# Patient Record
Sex: Female | Born: 1950 | Race: White | Hispanic: No | Marital: Married | Smoking: Never smoker
Health system: Northeastern US, Academic
[De-identification: ages and names within clinical notes are randomized; demographics above are authoritative.]

## PROBLEM LIST (undated history)

## (undated) DIAGNOSIS — E0500 Thyrotoxicosis with diffuse goiter without thyrotoxic crisis or storm: Secondary | ICD-10-CM

## (undated) DIAGNOSIS — I10 Essential (primary) hypertension: Secondary | ICD-10-CM

## (undated) DIAGNOSIS — IMO0001 Reserved for inherently not codable concepts without codable children: Secondary | ICD-10-CM

## (undated) DIAGNOSIS — I447 Left bundle-branch block, unspecified: Secondary | ICD-10-CM

## (undated) DIAGNOSIS — I509 Heart failure, unspecified: Secondary | ICD-10-CM

## (undated) DIAGNOSIS — E039 Hypothyroidism, unspecified: Secondary | ICD-10-CM

## (undated) DIAGNOSIS — IMO0002 Reserved for concepts with insufficient information to code with codable children: Secondary | ICD-10-CM

## (undated) DIAGNOSIS — E7800 Pure hypercholesterolemia, unspecified: Secondary | ICD-10-CM

---

## 2016-03-07 ENCOUNTER — Ambulatory Visit: Admitting: Internal Medicine

## 2016-03-14 ENCOUNTER — Ambulatory Visit: Admitting: Internal Medicine

## 2016-03-14 NOTE — Progress Notes (Signed)
.  Progress Notes  .  Patient: Stephanie [name]  Provider: Webb Laws MD  .  DOB: 11-23-1950 Age: 65 Y Sex: Female  Supervising Provider:: AMANDA VEST  Date: 03/14/2016  Date: 03/14/2016  .  --------------------------------------------------------------------------------  .  REASON FOR APPOINTMENT  .  1. Shortness of breath on exertion  .  2. Heart failure reduced EF, new patient  .  HISTORY OF PRESENT ILLNESS  .  GENERAL:   We had the pleasure to see Stephanie [name] today in our clinic at  [facility]. Stephanie [name] is a 73 yo with history of HTN,  HLD and graves disease sp radioactive ablation who presents with  SOB on exertion. Stephanie [name]'s symptoms started in the early 75s.  At that time she developed SOB on exertion. She was able to walk  a block adn climb 20 steps before developing SOB. She denied  orthopnea and PND. She also complained of "chest burning" that  did not correlated with exertion. Given the SOB and the chest  burning, in 2008 she had LHC that did not show evidence of  coronary artery disease. She also underwent echocardiogram that  showed mild to moderate mitral valve regurgitation and LVEDD at  5.6 cm. She was started on atenolol and enalapril and she  continued following with her cardiologist periodically. In 2010  she was diagnosed with Graves disease and she received  radioactive iodine ablation. Her symptoms had been stable up to a  year ago when she started having worsening SOB on exertion. She  ended up having SOB on minimal exeartion but she never developed  PND, orthopnea or LE edema. She underwent work up in her home  country of [place], including a TEE that was read as severe mitral  regurgitation, for which she was referred to a Careers adviser for MVr.  However on surgical review, the MR was recognized to be less  severe and functional in nature. Her LV was more dilated compared  to 2008 (6.1 cm) and her EF had dropped to 40%. The patient was  started on bisoprolol, entresto and lasix  about three months ago.  Today (03/14/16) she presents to our clinic accompanied by her  son and her husband. She reports significant improvement of her  symptoms since initiation of bisoprolol, entresto and lasix. She  continues having some SOB on exertion but it is not limiting and  she denies PND and orthopnea. She does however report that she is  not as active as she usedx to be and might be holding back on  pushing herself due to the cardiac condition. She also denies  weight changes, LE edema. She continues to have the same chest  burning that she had back in 2008 when her LHC was normal. She  denies history of MI or history of rheumatic fever. She is over  in the [place] visiting for the summer, and will be back again in the  late fall.  .  CURRENT MEDICATIONS  .  Taking Aspirin 81 MG Tablet Chewable 1 tablet Once a day  Taking Bisoprolol Fumarate 5 mg 1 tab 1/2 tab Once a day  Taking Entresto 49-51 MG Tablet 1 tablet Twice a day  Taking Lasix 20 MG Tablet 1 tablet Once a day  Taking Levothyroxine Sodium 50 MCG Tablet 1 tablet on an empty  stomach in the morning Once a day  Taking Lipitor 10 MG Tablet 1 tablet Once a day  Taking Omeprazole 10 MG Capsule Delayed  Release 1 capsule Once a  day  Medication List reviewed and reconciled with the patient  .  PAST MEDICAL HISTORY  .  Mild-moderate mitral regurgitation  HTN  HLD  OA  Graves disease sp radioactive ablation  NICM  LBBB  .  ALLERGIES  .  None  .  SURGICAL HISTORY  .  lumpectomy  .  FAMILY HISTORY  .  Mother is alive. She has CAD. Father is deceased. No history of  heart disease.  .  SOCIAL HISTORY  .  .  Tobaccohistory:Never smoked.  .  Caffeine: 3 cups daily.  .  Alcohol Denies.  .  Patient denies tobacco, ETOH, or IVDU.  Marland Kitchen  HOSPITALIZATION/MAJOR DIAGNOSTIC PROCEDURE  .  No hospitalization for heart failure  .  REVIEW OF SYSTEMS  .  CardioVascular:  .  Neurologic    no numbness or weakness, no visual disturbance, no  difficulty speaking, parasthesias .  General/Constitutional    no  fever, chills, night sweats, sleep disturbance, headaches, no  change in appetite, no fatigue, no weight gain/loss .  Gastrointestinal    no melena, no hematochezia, hematemses, no  blood in stool, nausea, vomiting, diarrhea, abdominal pain .  Hematology    no easy bruising, no prolonged bleeding .  Peripheral Vascular    no claudication, no skin ulceration .  Endocrine    no polydipsia, polyphagia, hot and cold intolerance  . Skin    no rash . Cardiovascular    no chest pain at rest or  with exertion, no dyspnea at rest or with exertion, no  palpitations, no fluid accumulation in the legs/edema, no  increase in abnormal girth, no presyncope, no syncope, no  orthopnea, no paroxysmal nocturnal dyspnea . Respiratory    no  cough, wheezing, shortness of breath at rest, sore throat, upper  airway congestion, or hemoptysis . Musculoskeletal    no  arthraglias, myalgias, or muscle aches .  Marland Kitchen  VITAL SIGNS  .  Pain scale 0, Ht-in 66, Wt-lbs 161, BMI 25.98, BP 139/82, HR 73,  RR 16, BSA 1.84, Ht-cm 167.64, Wt-kg 73.03, SaO2 98 %.  Marland Kitchen  EXAMINATION  .  Cardiac Testing:  ZOX:WRUEA rhythm. LBBB .  Echo:  .  Echocardiogram (03/14/16)  The left ventricle is mildly dilated. There is no thrombus. There  is normal left ventricular wall thickness. Left ventricular  systolic function is moderately reduced.Ejection Fraction =  35-40%. The interventricular septum is asynchronous. Basal-mid  septal akinesis.  Mild mitral and aortic valve regurgitation.  .  .  Cardiac Catheterization:LHC (2008): without evidence of CAD per  patient report .  LabsTSH 2.36, normal renal function and chem7, normal CBC, normal  CK  .  Marland Kitchen  PHYSICAL EXAMINATION  .  General Examination:  General Appearance  well developed, well nourished, alert,  oriented, in no acute distress. Head  normocephalic. Eyes  sclera  non-icteric. Oral Cavity  mucosa moist. Neck/Thyroid  carotid  pulse normal, no carotid bruit, no jugular venous  distension.  Skin  no rashes. Heart  S1, S2 regular, no murmurs, rub or  gallops. Lungs  clear to auscultation bilaterally with no  wheezing, no crackles, no rhonchi. Chest  no costochondral  tenderness. Abdomen  soft, non tender, non distended, bowel  sounds present, no abdominal or flank bruits, no organomegaly, no  prominent pulsation, no bruits. Extremities  no edema, no  clubbing, no cyanosis. Peripheral Pulses  2 + posterior tibial, 2  + radial, 2 +  dorsalis pedis, normal symmetric pulses  bilaterally. HEENT  normocephalic, oral mucosa moist.  .  ASSESSMENTS  .  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  Mild mitral regurgitation - I34.0  .  LBBB (left bundle branch block) - I44.7  .  In summary, Stephanie [name] is a 27 yo with history of HTN, HLD and  HFrEF who presents to establish care. She had a LHC in 2008 when  she was diagnosed with HFrEF that was normal, suggesting that the  etiology of her heart failure is non-ischemic cardiomyopathy. She  also has a long-standing LBBB. Initially it was thought that her  HF was due to primary MR, however in the recent echocardiogram  she appears to have functional MV regurgitation due to LV  dilation and asynchrony of the septum. Tachycardia induced  cardiomyopathy could be in our differential in the setting of  Graves disease. However this is less likely given the fact that  it is anticipated to be a recoverable cardiomyopathy and pt  continues to have HFrEF despite adequate rate control. Patient is  currently NYHA II, class C. She appears euvolemic on physical  exam. Today's echocardiogram showed decreased EF at 35-40%,  mildly dilated LV and mild MR and AI. Our goal is to optimize  medical management and then perform a cardiac MRI for better  characterization of the cardiac structure. The patient is having  LBBB with QRS of 150 msec. If she remains symptomatic despite  optimal medical therapy and if there is any downtrending of the  LVEF she would be a candidate  for ressynchronization therapy. The  plan for today is:- We will discontinue calcium channel blocker  in order to have room for optimization of Entresto- We plan to  increase Entresto to two tablets BID (equivalent to the 97/101mg   dose). We educated the patient about adverse effects of Entresto  and we asked her to decrease the dose back to 1 tab bid and call  our clinic if she develops dizziness, lightheadedness, fatigue. -  If patient tolerates the increased dose of entresto we will add  spironolactone at the next visit- Continue bisoprolol - littel  room for uptitration given that HR is 70bpm - Continue lasix at  current dose- Basic metabolic panel and BNP in a week (gave pt a  slip) - Cardiac MRI before her next appointment in December to  reassess LVEF and for scaring in the septal region to explain  LBBB- She may benefit from cardiac rehab - she will do cardiac  rehab once she returns back to [place] in a month. - Follow up in  4-5 months during next visit to [place].  Marland Kitchen  TREATMENT  .  Others  Notes: I personally interviewed and examined the patient and both  the fellow (Dr Tiburcio Bash and I contributed to this  electronic note. I agree with the history, exam, assessment and  plan as detailed in this note and edited it as necessary. I spent  70 minutes total in this patient encounter, of which >50% was  counselling and coordination of care.  .  FOLLOW UP  .  4-5 Months  .  Electronically signed by Boris Lown MD on  03/17/2016 at 09:47 PM EDT  .  CONFIRMATORY SIGN OFF  I personally interviewed and examined the patient and both the fellow (Dr Tiburcio Bash and I contributed to this electronic note. I agree with the history, exam, assessment and plan as detailed in this note and edited  it as necessary.  .  Document electronically signed by VEST, Dyanne Carrel MD  .

## 2016-03-14 NOTE — Progress Notes (Signed)
* * *        Stephanie [name]    --- ---    89 Y old Female, DOB: Aug 14, 1951, External MRN: [id number]    Account Number: [id number]    [address] [place], [id number]    Home: [phone]    Insurance: MEDICARE    Referring: Unknown Unknown    Appointment Facility: [facility]        * * *    03/14/2016  Progress Notes: Tahira Olivarez **CHN#:** [id number]    --- ---    ---        Reason for Appointment    ---      1\. Shortness of breath on exertion    ---    2\. Heart failure reduced EF, new patient    ---      History of Present Illness    ---     _GENERAL_ :    We had the pleasure to see Stephanie [name] today in our clinic at [facility]. Stephanie [name] is a 65 yo with history of HTN, HLD and graves disease sp  radioactive ablation who presents with SOB on exertion.    Stephanie [name]'s symptoms started in the early 70s. At that time she developed SOB  on exertion. She was able to walk a block adn climb 20 steps before developing  SOB. She denied orthopnea and PND. She also complained of "chest burning" that  did not correlated with exertion. Given the SOB and the chest burning, in 2008  she had LHC that did not show evidence of coronary artery disease. She also  underwent echocardiogram that showed mild to moderate mitral valve  regurgitation and LVEDD at 5.6 cm. She was started on atenolol and enalapril  and she continued following with her cardiologist periodically. In 2010 she  was diagnosed with Graves disease and she received radioactive iodine  ablation.    Her symptoms had been stable up to a year ago when she started having  worsening SOB on exertion. She ended up having SOB on minimal exeartion but  she never developed PND, orthopnea or LE edema. She underwent work up in her  home country of [place], including a TEE that was read as severe mitral  regurgitation, for which she was referred to a Careers adviser for MVr. However on  surgical review, the MR was recognized to be less severe and functional  in  nature. Her LV was more dilated compared to 2008 (6.1 cm) and her EF had  dropped to 40%. The patient was started on bisoprolol, entresto and lasix  about three months ago.    Today (03/14/16) she presents to our clinic accompanied by her son and her  husband. She reports significant improvement of her symptoms since initiation  of bisoprolol, entresto and lasix. She continues having some SOB on exertion  but it is not limiting and she denies PND and orthopnea. She does however  report that she is not as active as she usedx to be and might be holding back  on pushing herself due to the cardiac condition. She also denies weight  changes, LE edema. She continues to have the same chest burning that she had  back in 2008 when her LHC was normal. She denies history of MI or history of  rheumatic fever. She is over in the [place] visiting for the summer, and will be  back again in the late fall.      Current Medications    ---  Taking     * Aspirin 81 MG Tablet Chewable 1 tablet Once a day    ---    * Bisoprolol Fumarate 5 mg 1 tab 1/2 tab Once a day    ---    * Entresto 49-51 MG Tablet 1 tablet Twice a day    ---    * Lasix 20 MG Tablet 1 tablet Once a day    ---    * Levothyroxine Sodium 50 MCG Tablet 1 tablet on an empty stomach in the morning Once a day    ---    * Lipitor 10 MG Tablet 1 tablet Once a day    ---    * Omeprazole 10 MG Capsule Delayed Release 1 capsule Once a day    ---    * Medication List reviewed and reconciled with the patient    ---      Past Medical History    ---       Mild-moderate mitral regurgitation.        ---    HTN.        ---    HLD.        ---    OA.        ---    Graves disease sp radioactive ablation.        ---    NICM.        ---    LBBB.        ---      Surgical History    ---      lumpectomy    ---      Family History    ---      Mother is alive. She has CAD. Father is deceased. No history of heart  disease.    ---      Social History    ---    Tobacco  history: Never smoked.     Caffeine: 3 cups daily.    Alcohol  Denies.   Patient denies tobacco, ETOH, or IVDU.    ---      Allergies    ---      None    ---      Hospitalization/Major Diagnostic Procedure    ---      No hospitalization for heart failure    ---      Review of Systems    ---     _CardioVascular_ :    Neurologic no numbness or weakness, no visual disturbance, no difficulty  speaking, parasthesias. General/Constitutional no fever, chills, night sweats,  sleep disturbance, headaches, no change in appetite, no fatigue, no weight  gain/loss. Gastrointestinal no melena, no hematochezia, hematemses, no blood  in stool, nausea, vomiting, diarrhea, abdominal pain. Hematology no easy  bruising, no prolonged bleeding. Peripheral Vascular no claudication, no skin  ulceration. Endocrine no polydipsia, polyphagia, hot and cold intolerance.  Skin no rash. Cardiovascular no chest pain at rest or with exertion, no  dyspnea at rest or with exertion, no palpitations, no fluid accumulation in  the legs/edema, no increase in abnormal girth, no presyncope, no syncope, no  orthopnea, no paroxysmal nocturnal dyspnea. Respiratory no cough, wheezing,  shortness of breath at rest, sore throat, upper airway congestion, or  hemoptysis. Musculoskeletal no arthraglias, myalgias, or muscle aches.          Vital Signs    ---    Pain scale 0, Ht-in 66, Wt-lbs 161, BMI 25.98, BP 139/82, HR 73, RR 16,  BSA  1.84, Ht-cm 167.64, Wt-kg 73.03, SaO2 98 %.      Examination    ---     _Cardiac Testing_ :    EKG: Sinus rhythm. LBBB .    Echo:    Echocardiogram (03/14/16)    The left ventricle is mildly dilated. There is no thrombus. There is normal  left ventricular wall thickness. Left ventricular systolic function is  moderately reduced.Ejection Fraction = 35-40%. The interventricular septum is  asynchronous. Basal-mid septal akinesis.    Mild mitral and aortic valve regurgitation.          .    Cardiac Catheterization: LHC (2008): without evidence of CAD per  patient  report .    Labs TSH 2.36, normal renal function and chem7, normal CBC, normal CK    .          Physical Examination    ---     _General Examination_ :    General Appearance well developed, well nourished, alert, oriented, in no  acute distress. Head normocephalic. Eyes sclera non-icteric. Oral Cavity  mucosa moist. Neck/Thyroid carotid pulse normal, no carotid bruit, no jugular  venous distension. Skin no rashes. Heart S1, S2 regular, no murmurs, rub or  gallops. Lungs clear to auscultation bilaterally with no wheezing, no  crackles, no rhonchi. Chest no costochondral tenderness. Abdomen soft, non  tender, non distended, bowel sounds present, no abdominal or flank bruits, no  organomegaly, no prominent pulsation, no bruits. Extremities no edema, no  clubbing, no cyanosis. Peripheral Pulses 2 + posterior tibial, 2 + radial, 2 +  dorsalis pedis, normal symmetric pulses bilaterally. HEENT normocephalic, oral  mucosa moist.          Assessments    ---    1\. 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 Mild mitral regurgitation - I34.0    ---    3\. LBBB (left bundle branch block) - I44.7    ---      In summary, Stephanie Mcsorley is a 78 yo with history of HTN, HLD and HFrEF who  presents to establish care. She had a LHC in 2008 when she was diagnosed with  HFrEF that was normal, suggesting that the etiology of her heart failure is  non-ischemic cardiomyopathy. She also has a long-standing LBBB. Initially it  was thought that her HF was due to primary MR, however in the recent  echocardiogram she appears to have functional MV regurgitation due to LV  dilation and asynchrony of the septum. Tachycardia induced cardiomyopathy  could be in our differential in the setting of Graves disease. However this is  less likely given the fact that it is anticipated to be a recoverable  cardiomyopathy and pt continues to have HFrEF despite adequate rate control.    Patient is currently NYHA II, class C. She appears  euvolemic on physical exam.  Today's echocardiogram showed decreased EF at 35-40%, mildly dilated LV and  mild MR and AI. Our goal is to optimize medical management and then perform a  cardiac MRI for better characterization of the cardiac structure. The patient  is having LBBB with QRS of 150 msec. If she remains symptomatic despite  optimal medical therapy and if there is any downtrending of the LVEF she would  be a candidate for ressynchronization therapy.    The plan for today is:    - We will discontinue calcium channel blocker in order to have room for  optimization of  Sherryll Burger    - We plan to increase Entresto to two tablets BID (equivalent to the 97/101mg   dose). We educated the patient about adverse effects of Entresto and we asked  her to decrease the dose back to 1 tab bid and call our clinic if she develops  dizziness, lightheadedness, fatigue.    - If patient tolerates the increased dose of entresto we will add  spironolactone at the next visit    - Continue bisoprolol - littel room for uptitration given that HR is 70bpm    - Continue lasix at current dose    - Basic metabolic panel and BNP in a week (gave pt a slip)    - Cardiac MRI before her next appointment in December to reassess LVEF and  for scaring in the septal region to explain LBBB    - She may benefit from cardiac rehab - she will do cardiac rehab once she  returns back to [place] in a month.    - Follow up in 4-5 months during next visit to [place].    ---      Treatment    ---       **1\. Others**    Notes: I personally interviewed and examined the patient and both the fellow  (Dr Tiburcio Bash and I contributed to this electronic note. I agree with  the history, exam, assessment and plan as detailed in this note and edited it  as necessary. I spent 70 minutes total in this patient encounter, of which  >50% was counselling and coordination of care.    ---      Follow Up    ---    4-5 Months    Electronically signed by Boris Lown MD on  03/17/2016 at 09:47 PM EDT    Sign off status: Completed        * * *        Cardiac Subspecialty    [address]    [place], [place] [address]    Tel: [phone]    Fax: [phone]              * * *          Patient: MARIONA, SCHOLES DOB: Feb 03, 1951 Progress Note: Khala Tarte  03/14/2016    ---    Note generated by eClinicalWorks EMR/PM Software ([email or web address])

## 2016-03-20 ENCOUNTER — Ambulatory Visit: Admitting: Internal Medicine

## 2016-03-20 NOTE — Progress Notes (Signed)
* * *        **  Stephanie [name]**    --- ---    20 Y old Female, DOB: 1951-06-12    [address] [place], [place] [address]    Home: [phone]    Provider: Nyasha Rahilly, Dyanne Carrel, MD        * * *    Telephone Encounter    ---    Answered by   Boris Lown R  Date: 03/20/2016         Time: 10:09 AM    Message                      Called [name] back on [phone] to relay the final read on her echo report to her. Plan remains for MRI and clinic f/up when she visits again in December. Currenlty LVEF above the threshold for CRT-D, but will reassess. Entresto increase is going okay thus far on day 2. She will do labs locally on Saturday.       She asked that I mail her the clinic note and send a script for the higher Entresto dose for when she needs a refill - she will send me an address and pharmacy to do so.         --- ---            Action Taken   Thanks [place]. The labs arrived this afternoon - please can you  pass along the message to your mom that they all look great and no med changes  are needed (Cr 0.69, K 4.9, BNP 32, Na 144). Is she doing okay in terms of  blood pressure and symptoms? I will mail out her clinic note to your address,  so that she can take it to her cardiologist back home, and send a refill for  the higher dose of [facility]rest[facility] to the [facility] in case she starts to run out of pills.  Best wishes, Marchelle Folks From: Arne Cleveland Sent: Monday, March 26, 2016 6:59 PM  To: Boris Lown Subject: Re: my mom Stephanie [name], I hope you're doing well.  Thanks for the birthday wishes by the way. My mom did her labs on Saturday, so  they should be heading your way soon. She mentioned that you needed my address  and a pharmacy: Address is: [address] [place], [place] [address] [facility] [address] [place], [place] [address] Tel: [phone] Thanks            Refills  Refill Entresto Tablet, 97-103 MG, Orally, 60, 1 tablet, Twice a day,  30 days, Refills=11    --- ---          * * *                ---          * * *           PatientRoxan [name] DOB: 1951-03-21 Provider: Webb Laws, MD  03/20/2016    ---    Note generated by eClinicalWorks EMR/PM Software ([email or web address])

## 2016-03-26 ENCOUNTER — Ambulatory Visit

## 2016-04-20 ENCOUNTER — Ambulatory Visit

## 2016-04-20 ENCOUNTER — Ambulatory Visit: Admitting: Internal Medicine

## 2016-04-20 NOTE — Progress Notes (Signed)
* * *        **  Stephanie [name]**    --- ---    81 Y old Female, DOB: 28-Feb-1951    [address], [place], [place] [address]    Home: [phone]    Provider: Paetyn Pietrzak, Dyanne Carrel, MD        * * *    Telephone Encounter    ---    Answered by   Gina Costilla R  Date: 04/20/2016         Time: 06:21 PM    Refills  Refill Entresto Tablet, 97-103 MG, Orally, 60, 1 tablet, Twice a day,  30 days, Refills=11    --- ---          * * *                ---          * * *          PatientRoxan [name] DOB: 1951/05/31 Provider: Webb Laws, MD  04/20/2016    ---    Note generated by eClinicalWorks EMR/PM Software ([email or web address])

## 2016-05-09 ENCOUNTER — Emergency Department (HOSPITAL_COMMUNITY)
Admission: EM | Admit: 2016-05-09 | Discharge: 2016-05-10 | Disposition: A | Discharge status: Home / Self Care | Payer: Medicare Other | Attending: Emergency Medicine | Admitting: Emergency Medicine

## 2016-05-09 ENCOUNTER — Encounter (HOSPITAL_COMMUNITY): Payer: Self-pay | Admitting: Emergency Medicine

## 2016-05-09 DIAGNOSIS — I110 Hypertensive heart disease with heart failure: Secondary | ICD-10-CM | POA: Diagnosis not present

## 2016-05-09 DIAGNOSIS — E039 Hypothyroidism, unspecified: Secondary | ICD-10-CM | POA: Diagnosis not present

## 2016-05-09 DIAGNOSIS — R1013 Epigastric pain: Secondary | ICD-10-CM | POA: Diagnosis present

## 2016-05-09 DIAGNOSIS — K449 Diaphragmatic hernia without obstruction or gangrene: Secondary | ICD-10-CM | POA: Diagnosis not present

## 2016-05-09 DIAGNOSIS — I509 Heart failure, unspecified: Secondary | ICD-10-CM | POA: Insufficient documentation

## 2016-05-09 DIAGNOSIS — Z7982 Long term (current) use of aspirin: Secondary | ICD-10-CM | POA: Insufficient documentation

## 2016-05-09 DIAGNOSIS — Z79899 Other long term (current) drug therapy: Secondary | ICD-10-CM | POA: Insufficient documentation

## 2016-05-09 HISTORY — DX: Essential (primary) hypertension: I10

## 2016-05-09 HISTORY — DX: Thyrotoxicosis with diffuse goiter without thyrotoxic crisis or storm: E05.00

## 2016-05-09 HISTORY — DX: Heart failure, unspecified: I50.9

## 2016-05-09 HISTORY — DX: Hypothyroidism, unspecified: E03.9

## 2016-05-09 HISTORY — DX: Reserved for inherently not codable concepts without codable children: IMO0001

## 2016-05-09 HISTORY — DX: Left bundle-branch block, unspecified: I44.7

## 2016-05-09 HISTORY — DX: Reserved for concepts with insufficient information to code with codable children: IMO0002

## 2016-05-09 HISTORY — DX: Pure hypercholesterolemia, unspecified: E78.00

## 2016-05-09 LAB — CBC
HEMATOCRIT: 40.9 % (ref 36.0–46.0)
HEMOGLOBIN: 14.1 g/dL (ref 12.0–15.0)
MCH: 29 pg (ref 26.0–34.0)
MCHC: 34.5 g/dL (ref 30.0–36.0)
MCV: 84.2 fL (ref 78.0–100.0)
Platelets: 243 10*3/uL (ref 150–400)
RBC: 4.86 MIL/uL (ref 3.87–5.11)
RDW: 13.7 % (ref 11.5–15.5)
WBC: 10.5 10*3/uL (ref 4.0–10.5)

## 2016-05-09 LAB — URINALYSIS, ROUTINE W REFLEX MICROSCOPIC
BILIRUBIN URINE: NEGATIVE
Glucose, UA: NEGATIVE mg/dL
HGB URINE DIPSTICK: NEGATIVE
KETONES UR: NEGATIVE mg/dL
NITRITE: NEGATIVE
PROTEIN: NEGATIVE mg/dL
Specific Gravity, Urine: 1.018 (ref 1.005–1.030)
pH: 6.5 (ref 5.0–8.0)

## 2016-05-09 LAB — COMPREHENSIVE METABOLIC PANEL
ALBUMIN: 4.5 g/dL (ref 3.5–5.0)
ALT: 29 U/L (ref 14–54)
ANION GAP: 8 (ref 5–15)
AST: 24 U/L (ref 15–41)
Alkaline Phosphatase: 49 U/L (ref 38–126)
BILIRUBIN TOTAL: 0.5 mg/dL (ref 0.3–1.2)
BUN: 21 mg/dL — ABNORMAL HIGH (ref 6–20)
CO2: 21 mmol/L — AB (ref 22–32)
Calcium: 9.2 mg/dL (ref 8.9–10.3)
Chloride: 107 mmol/L (ref 101–111)
Creatinine, Ser: 0.72 mg/dL (ref 0.44–1.00)
GFR calc non Af Amer: >60 mL/min (ref >60)
GLUCOSE: 168 mg/dL — AB (ref 65–99)
POTASSIUM: 3.6 mmol/L (ref 3.5–5.1)
SODIUM: 136 mmol/L (ref 135–145)
TOTAL PROTEIN: 7.7 g/dL (ref 6.5–8.1)

## 2016-05-09 LAB — I-STAT TROPONIN, ED: TROPONIN I, POC: 0 ng/mL (ref 0.00–0.08)

## 2016-05-09 LAB — URINE MICROSCOPIC-ADD ON

## 2016-05-09 LAB — PROTIME-INR
INR: 0.94
Prothrombin Time: 12.6 seconds (ref 11.4–15.2)

## 2016-05-09 LAB — TROPONIN I: Troponin I: <0.03 ng/mL (ref <0.03)

## 2016-05-09 LAB — LIPASE, BLOOD: Lipase: 23 U/L (ref 11–51)

## 2016-05-09 LAB — BRAIN NATRIURETIC PEPTIDE: B Natriuretic Peptide: 30.2 pg/mL (ref 0.0–100.0)

## 2016-05-09 MED ORDER — SODIUM CHLORIDE 0.9 % IV SOLN
INTRAVENOUS | Status: DC
  Administered 2016-05-10: 01:00:00 via INTRAVENOUS

## 2016-05-09 MED ORDER — FAMOTIDINE IN NACL 20-0.9 MG/50ML-% IV SOLN
20.0000 mg | Freq: Once | INTRAVENOUS | Status: AC
  Administered 2016-05-09: 20 mg via INTRAVENOUS
  Filled 2016-05-09: qty 50

## 2016-05-09 NOTE — ED Triage Notes (Signed)
Pt states that she started having epigastric pain 3 hours ago with dizziness and nausea. Alert and oriented. Hx of LBBB and mild CHF.

## 2016-05-10 ENCOUNTER — Emergency Department (HOSPITAL_COMMUNITY): Payer: Medicare Other

## 2016-05-10 MED ORDER — HYDROCODONE-ACETAMINOPHEN 5-325 MG PO TABS: 1.0000 | ORAL_TABLET | ORAL | 0 refills | Status: AC | PRN

## 2016-05-10 MED ORDER — HYDROCODONE-ACETAMINOPHEN 5-325 MG PO TABS
1.0000 | ORAL_TABLET | Freq: Once | ORAL | Status: AC
  Administered 2016-05-10: 1 via ORAL
  Filled 2016-05-10: qty 1

## 2016-05-10 MED ORDER — FAMOTIDINE 20 MG PO TABS: 20.0000 mg | ORAL_TABLET | Freq: Two times a day (BID) | ORAL | 0 refills | Status: AC

## 2016-05-10 MED ORDER — SUCRALFATE 1 GM/10ML PO SUSP: 1.0000 g | Freq: Three times a day (TID) | ORAL | 0 refills | Status: AC

## 2016-05-10 NOTE — ED Provider Notes (Signed)
[facility]-EMERGENCY DEPT Provider Note   CSN: [id number] Arrival date & time: 05/09/16  2038     History   Chief Complaint Chief Complaint  Patient presents with  . Abdominal Pain    HPI Paula Dunlap is a 65 y.o. female.  HPI Patient developed epigastric pain that came on fairly rapidly this evening. She denies radiation into her back. She did have an episode of vomiting in association with this. The pain is aching and cramping in quality. No associated diarrhea. Patient denies pain burning or urgency to urination. She reports she did have similar pain once but ultimately is started to go away so she did not have any diagnostic  Evaluation. Patient reports she does have a history of heart disease with congestive heart failure and a known left bundle-branch block.. Patient denies having any urinary symptoms of pain burning or urgency. Patient does still have her gallbladder. Past Medical History:  Diagnosis Date  . CHF (congestive heart failure) (HCC)   . Graves disease   . High cholesterol   . Hypertension   . Hypothyroid   . LBBB (left bundle branch block)   . Radiation    Thyroid    There are no active problems to display for this patient.   History reviewed. No pertinent surgical history.  OB History    No data available       Home Medications    Prior to Admission medications   Medication Sig Start Date End Date Taking? Authorizing Provider  aspirin 81 MG tablet Take 81 mg by mouth daily.   Yes Historical Provider, MD  atorvastatin (LIPITOR) 10 MG tablet Take 10 mg by mouth daily.   Yes Historical Provider, MD  bisoprolol (ZEBETA) 5 MG tablet Take 2.5 mg by mouth daily.   Yes Historical Provider, MD  furosemide (LASIX) 20 MG tablet Take 20 mg by mouth daily.   Yes Historical Provider, MD  levothyroxine (SYNTHROID, LEVOTHROID) 50 MCG tablet Take 50-100 mcg by mouth daily before breakfast. Sun-fri she takes 50 mcg and sat-sun she takes 100 mcg   Yes Historical  Provider, MD  omeprazole (PRILOSEC) 10 MG capsule Take 10 mg by mouth daily.   Yes Historical Provider, MD  Sacubitril-Valsartan (ENTRESTO PO) Take 100 mg by mouth 2 (two) times daily.   Yes Historical Provider, MD  famotidine (PEPCID) 20 MG tablet Take 1 tablet (20 mg total) by mouth 2 (two) times daily. 05/10/16   Arby Barrette, MD  HYDROcodone-acetaminophen (NORCO/VICODIN) 5-325 MG tablet Take 1-2 tablets by mouth every 4 (four) hours as needed for moderate pain or severe pain. 05/10/16   Arby Barrette, MD  sucralfate (CARAFATE) 1 GM/10ML suspension Take 10 mLs (1 g total) by mouth 4 (four) times daily -  with meals and at bedtime. 05/10/16   Arby Barrette, MD    Family History No family history on file.  Social History Social History  Substance Use Topics  . Smoking status: Never Smoker  . Smokeless tobacco: Not on file  . Alcohol use No     Allergies   Review of patient's allergies indicates no known allergies.   Review of Systems Review of Systems 10 Systems reviewed and are negative for acute change except as noted in the HPI.  Physical Exam Updated Vital Signs BP 133/80 (BP Location: Right Arm)   Pulse 69   Temp 97.6 F (36.4 C) (Oral)   Resp 19   Ht 5\' 2"  (1.575 m)   Wt 154 lb 5.2 oz (  70 kg)   SpO2 95%   BMI 28.23 kg/m   Physical Exam  Constitutional: She is oriented to person, place, and time. She appears well-developed and well-nourished. No distress.  HENT:  Head: Normocephalic and atraumatic.  Right Ear: External ear normal.  Left Ear: External ear normal.  Nose: Nose normal.  Mouth/Throat: Oropharynx is clear and moist.  Eyes: Conjunctivae and EOM are normal.  Neck: Neck supple.  Cardiovascular: Normal rate and regular rhythm.   No murmur heard. Pulmonary/Chest: Effort normal and breath sounds normal. No respiratory distress.  Abdominal: Soft. There is tenderness.  Moderate epigastric pain without guarding or palpable mass.  Musculoskeletal: She  exhibits no edema, tenderness or deformity.  Neurological: She is alert and oriented to person, place, and time. No cranial nerve deficit. She exhibits normal muscle tone. Coordination normal.  Skin: Skin is warm and dry.  Psychiatric: She has a normal mood and affect.  Nursing note and vitals reviewed.    ED Treatments / Results  Labs (all labs ordered are listed, but only abnormal results are displayed) Labs Reviewed  COMPREHENSIVE METABOLIC PANEL - Abnormal; Notable for the following:       Result Value   CO2 21 (*)    Glucose, Bld 168 (*)    BUN 21 (*)    All other components within normal limits  URINALYSIS, ROUTINE W REFLEX MICROSCOPIC (NOT AT [facility]) - Abnormal; Notable for the following:    Leukocytes, UA MODERATE (*)    All other components within normal limits  URINE MICROSCOPIC-ADD ON - Abnormal; Notable for the following:    Squamous Epithelial / LPF 6-30 (*)    Bacteria, UA RARE (*)    All other components within normal limits  LIPASE, BLOOD  CBC  BRAIN NATRIURETIC PEPTIDE  TROPONIN I  PROTIME-INR  CBC WITH DIFFERENTIAL/PLATELET  I-STAT TROPOININ, ED    EKG  EKG Interpretation  Date/Time:  Wednesday May 09 2016 21:02:52 EDT Ventricular Rate:  83 PR Interval:    QRS Duration: 159 QT Interval:  413 QTC Calculation: 486 R Axis:   -16 Text Interpretation:  Sinus rhythm Left bundle branch block Baseline wander in lead(s) V1 agree. ischemic appearing ST depression Confirmed by Donnald GarrePfeiffer, MD, Lebron ConnersMarcy [phone]([id number]) on 05/09/2016 9:07:05 PM       Radiology Ct Abdomen Pelvis Wo Contrast  Result Date: 05/10/2016 CLINICAL DATA:  Epigastric pain, dizziness, and nausea beginning 3 hours ago. EXAM: CT ABDOMEN AND PELVIS WITHOUT CONTRAST TECHNIQUE: Multidetector CT imaging of the abdomen and pelvis was performed following the standard protocol without IV contrast. COMPARISON:  None. FINDINGS: Lower chest: Calcified granulomas in the left lung base. Calcification in the  aortic valve. Coronary artery calcifications. Small esophageal hiatal hernia. Hepatobiliary: Diffuse fatty infiltration of the liver. Focal low-attenuation adjacent to the gallbladder fossa is probably focal fatty change. Gallbladder and bile ducts are unremarkable. Pancreas: Unenhanced appearance is unremarkable. Spleen: Unenhanced appearance is unremarkable. Adrenals/Urinary Tract: Calcification in the right adrenal gland may be represents a old hemorrhage or old infection. Cyst in the left kidney. No hydronephrosis in either kidney. No renal or ureteral stones. Bladder wall is not thickened. Stomach/Bowel: Stomach is within normal limits. Appendix appears normal. No evidence of bowel wall thickening, distention, or inflammatory changes. Vascular/Lymphatic: Aortic atherosclerosis. No enlarged abdominal or pelvic lymph nodes. Reproductive: There is a large uterine mass, likely fibroid, measuring about 9.7 x 13.8 cm in diameter. No abnormal adnexal masses. Other: No abdominal wall hernia or abnormality. No abdominopelvic ascites.  Musculoskeletal: Degenerative changes in the spine. No destructive bone lesions. Slight anterior subluxation at L4-5. IMPRESSION: Large uterine mass, likely a fibroid. Diffuse fatty infiltration of the liver. No evidence of bowel obstruction or inflammation. Small esophageal hiatal hernia. Electronically Signed   By: Burman Nieves M.D.   On: 05/10/2016 01:04    Procedures Procedures (including critical care time)  Medications Ordered in ED Medications  0.9 %  sodium chloride infusion ( Intravenous New Bag/Given 05/10/16 0055)  HYDROcodone-acetaminophen (NORCO/VICODIN) 5-325 MG per tablet 1 tablet (not administered)  famotidine (PEPCID) IVPB 20 mg premix (0 mg Intravenous Stopped 05/09/16 2202)     Initial Impression / Assessment and Plan / ED Course  I have reviewed the triage vital signs and the nursing notes.  Pertinent labs & imaging results that were available during  my care of the patient were reviewed by me and considered in my medical decision making (see chart for details).  Clinical Course     Final Clinical Impressions(s) / ED Diagnoses   Final diagnoses:  Hiatal hernia  Epigastric pain  Diagnostic workup does not reveal specific etiology for the patient's symptoms. Symptoms however seems very gastric in nature. They are exacerbated by drinking contrast. They were onset several hours after eating fried chicken bites. Patient does not show signs of biliary inflammation or elevated LFTs. I did however review with patient and family members possibility, of needing a HIDA scan to further assess the gallbladder. At this time I do feel patient  Can continue outpatient treatment. She also has hiatal hernia which we treated as usual. Will add carafate and pepcid.  New Prescriptions New Prescriptions   FAMOTIDINE (PEPCID) 20 MG TABLET    Take 1 tablet (20 mg total) by mouth 2 (two) times daily.   HYDROCODONE-ACETAMINOPHEN (NORCO/VICODIN) 5-325 MG TABLET    Take 1-2 tablets by mouth every 4 (four) hours as needed for moderate pain or severe pain.   SUCRALFATE (CARAFATE) 1 GM/10ML SUSPENSION    Take 10 mLs (1 g total) by mouth 4 (four) times daily -  with meals and at bedtime.     Arby Barrette, MD 05/10/16 [phone]

## 2016-05-11 LAB — URINE CULTURE

## 2016-06-06 ENCOUNTER — Ambulatory Visit

## 2017-01-30 ENCOUNTER — Ambulatory Visit: Admitting: Internal Medicine

## 2017-01-30 LAB — HX CHEM-PANELS
HX ANION GAP: 7 (ref 5–18)
HX BLOOD UREA NITROGEN: 20 mg/dL (ref 6–24)
HX CHLORIDE (CL): 109 meq/L (ref 98–110)
HX CO2: 25 meq/L (ref 20–30)
HX CREATININE (CR): 0.75 mg/dL (ref 0.57–1.30)
HX GFR, AFRICAN AMERICAN: 96 mL/min/{1.73_m2}
HX GFR, NON-AFRICAN AMERICAN: 82 mL/min/{1.73_m2} — ABNORMAL LOW
HX GLUCOSE: 86 mg/dL (ref 70–139)
HX POTASSIUM (K): 4.7 meq/L (ref 3.6–5.1)
HX SODIUM (NA): 141 meq/L (ref 135–145)

## 2017-01-30 LAB — HX CHOLESTEROL
HX CHOLESTEROL: 168 mg/dL (ref 110–199)
HX HIGH DENSITY LIPOPROTEIN CHOL (HDL): 40 mg/dL (ref 35–75)
HX LDL: 104 mg/dL (ref 0–129)
HX TRIGLYCERIDES: 122 mg/dL (ref 40–250)

## 2017-01-30 LAB — HX HEM-ROUTINE
HX BASO #: 0 10*3/uL (ref 0.0–0.2)
HX BASO: 1 %
HX EOSIN #: 0.2 10*3/uL (ref 0.0–0.5)
HX EOSIN: 2 %
HX HCT: 42.6 % (ref 32.0–45.0)
HX HGB: 14.5 g/dL (ref 11.0–15.0)
HX IMMATURE GRANULOCYTE#: 0 10*3/uL (ref 0.0–0.1)
HX IMMATURE GRANULOCYTE: 0 %
HX LYMPH #: 3.4 10*3/uL (ref 1.0–4.0)
HX LYMPH: 44 %
HX MCH: 29 pg (ref 26.0–34.0)
HX MCHC: 34 g/dL (ref 32.0–36.0)
HX MCV: 85.2 fL (ref 80.0–98.0)
HX MONO #: 0.5 10*3/uL (ref 0.2–0.8)
HX MONO: 6 %
HX MPV: 10.6 fL (ref 9.1–11.7)
HX NEUT #: 3.7 10*3/uL (ref 1.5–7.5)
HX PLT: 214 10*3/uL (ref 150–400)
HX RBC BLOOD COUNT: 5 M/uL (ref 3.70–5.00)
HX RDW: 13.4 % (ref 11.5–14.5)
HX SEG NEUT: 47 %
HX WBC: 7.8 10*3/uL (ref 4.0–11.0)

## 2017-01-30 LAB — HX CHEM-LIPIDS
HX CHOL-HDL RATIO: 4.2
HX CHOLESTEROL: 168 mg/dL (ref 110–199)
HX HIGH DENSITY LIPOPROTEIN CHOL (HDL): 40 mg/dL (ref 35–75)
HX HOURS FAST: 4 h
HX LDL: 104 mg/dL (ref 0–129)
HX TRIGLYCERIDES: 122 mg/dL (ref 40–250)

## 2017-01-30 NOTE — Progress Notes (Signed)
* * *        Roxan Hockey    --- ---    66 Y old Female, DOB: [birth year], External MRN: [id number]    Account Number: [id number]    [address], [place], [id number]    Home: [phone]    Insurance: MEDICARE    PCP: Unknown Unknown Referring: Unknown Unknown    Appointment Facility: [facility]        * * *    01/30/2017  Progress Notes: Gavina Dildine **CHN#:** [id number]    --- ---    ---        Reason for Appointment    ---      1\. CHF F/U APPT. / ECHO @ 1PM    ---      History of Present Illness    ---     _GENERAL_ :    We had the pleasure to see Mrs Newmann today in our clinic at [facility]. Mrs Dercole is a 66 yo with history of HTN, HLD and graves disease sp  radioactive ablation who presents with SOB on exertion.    Mrs Scheeler's symptoms started in the early 30s. At that time she developed SOB  on exertion. She was able to walk a block adn climb 20 steps before developing  SOB. She denied orthopnea and PND. She also complained of "chest burning" that  did not correlated with exertion. Given the SOB and the chest burning, in 2008  she had LHC that did not show evidence of coronary artery disease. She also  underwent echocardiogram that showed mild to moderate mitral valve  regurgitation and LVEDD at 5.6 cm. She was started on atenolol and enalapril  and she continued following with her cardiologist periodically. In 2010 she  was diagnosed with Graves disease and she received radioactive iodine  ablation.    Her symptoms had been stable up to a year ago when she started having  worsening SOB on exertion. She ended up having SOB on minimal exeartion but  she never developed PND, orthopnea or LE edema. She underwent work up in her  home country of [place], including a TEE that was read as severe mitral  regurgitation, for which she was referred to a Careers adviser for [facility]. However on  surgical review, the MR was recognized to be less severe and functional in  nature. Her LV was more dilated  compared to 2008 (6.1 cm) and her EF had  dropped to 40%. The patient was started on bisoprolol, entresto and lasix  about three months ago.    Mrs Munce was last seen almost a year ago in 03/14/16. She has been back in  the [place] visiting family this summer and so presents for follow-u today 01/30/17.  SHe has been very well in the interim. Her husband has been undergoing cancer  therapy and so she has been busy supporting him but she denies any exercise  limitations. Can walk 25 mins comfortably. No dyspnea on exertion, orthopnea,  palps or dizziness. She is tolerating the max Entresto dose very well. She  also denies weight changes, LE edema. She continues to have the same chest  burning that she had back in 2008 when her LHC was normal and had an  EGD/colonscopy locally recently with confirmation of gastritis as the chest  pain etiology.      Current Medications    ---    Taking     * Aspirin 81 MG Tablet Chewable  1 tablet Orally Once a day    ---    * Bisoprolol Fumarate 5 MG Tablet 2.5mg  only Orally Once a day    ---    * Celebrex - Capsule Arcoxia (non-FDA approved) 90mg  Orally Once a day    ---    * Entresto 97-103 MG Tablet 1 tablet Orally Twice a day    ---    * Lasix 20 MG Tablet 1 tablet Orally Once a day    ---    * Levothyroxine Sodium 50 mcg Tablet 6 days and 1 day Orally Once a day    ---    * Lipitor 10 MG Tablet 1 tablet Orally Once a day    ---    * Omeprazole 10 MG Capsule Delayed Release 1 capsule Orally Once a day    ---    * Medication List reviewed and reconciled with the patient    ---      Past Medical History    ---       MIld mitral regurgitation.        ---    HTN.        ---    HLD.        ---    OA.        ---    Graves disease sp radioactive ablation.        ---    NICM.        ---    LBBB.        ---    Gastritis .        ---      Surgical History    ---      lumpectomy    ---      Family History    ---      Mother is alive. She has CAD. Father is deceased. No history of  heart  disease.    ---      Social History    ---    Tobacco  history: Never smoked.    Caffeine: 3 cups daily.    Alcohol  Denies.   Patient denies tobacco, ETOH, or IVDU    Two sons are doctors    Primarily resides in [place].    ---      Allergies    ---      None    ---      Hospitalization/Major Diagnostic Procedure    ---      No hospitalization for heart failure    ---      Review of Systems    ---     _CardioVascular_ :    Neurologic no numbness or weakness, no visual disturbance, no difficulty  speaking, parasthesias. General/Constitutional no fever, chills, night sweats,  sleep disturbance, headaches, no change in appetite, no fatigue, no weight  gain/loss. Gastrointestinal no melena, no hematochezia, hematemses, no blood  in stool, nausea, vomiting, diarrhea, abdominal pain. Hematology no easy  bruising, no prolonged bleeding. Peripheral Vascular no claudication, no skin  ulceration. Endocrine no polydipsia, polyphagia, hot and cold intolerance.  Skin no rash. Cardiovascular no chest pain at rest or with exertion, no  dyspnea at rest or with exertion, no palpitations, no fluid accumulation in  the legs/edema, no increase in abnormal girth, no presyncope, no syncope, no  orthopnea, no paroxysmal nocturnal dyspnea. Respiratory no cough, wheezing,  shortness of breath at rest, sore throat, upper airway congestion, or  hemoptysis. Musculoskeletal  no arthraglias, myalgias, or muscle aches.          Vital Signs    ---    98% RA, 62 bpm    121/69    68.5kg = 151lbs.      Examination    ---     _Cardiac Testing_ :    EKG: Sinus rhythm. LBBB .    Echo:    01/30/17:    The left ventricle is mildly dilated. There is no thrombus. There is normal  left ventricular wall thickness. Left ventricular systolic function is  moderately reduced. Ejection Fraction = 35%. The interventricular septum is  asynchronous. Basal-mid septal akinesis. The right ventricle is normal in size  and function. The left atrial size is normal.  Right atrial size is normal. A  dilated inferior vena cava suggests increased right atrial pressure. The  mitral valve leaflets appear mildly thickened,. There is moderate mitral  annular calcification. There is mild mitral regurgitation. The tricuspid valve  is normal. There is mild tricuspid regurgitation. Right ventricular systolic  pressure is normal. The aortic valve is trileaflet. The aortic valve opens  well. Mild aortic regurgitation. The pulmonic valve is not well seen, but is  grossly normal. Trace pulmonic valvular regurgitation. The aortic root is  normal size. The ascending aorta is normal size. There is no pericardial  effusion.        03/14/16: The left ventricle is mildly dilated. There is no thrombus. There is  normal left ventricular wall thickness. Left ventricular systolic function is  moderately reduced.Ejection Fraction = 35-40%. The interventricular septum is  asynchronous. Basal-mid septal akinesis. Mild mitral and aortic valve  regurgitation..    Cardiac Catheterization: LHC (2008): without evidence of CAD per patient  report .    Labs TSH 2.36, normal renal function and chem7, normal CBC, normal CK    .          Physical Examination    ---     _General Examination_ :    General Appearance well developed, well nourished, alert, oriented, in no  acute distress. Head normocephalic. Eyes sclera non-icteric. Oral Cavity  mucosa moist. Neck/Thyroid carotid pulse normal, no carotid bruit, no jugular  venous distension. Skin no rashes. Heart S1, S2 regular, no murmurs, rub or  gallops. Lungs clear to auscultation bilaterally with no wheezing, no  crackles, no rhonchi. Chest no costochondral tenderness. Abdomen soft, non  tender, non distended, bowel sounds present, no abdominal or flank bruits, no  organomegaly, no prominent pulsation, no bruits. Extremities no edema, no  clubbing, no cyanosis. Peripheral Pulses 2 + posterior tibial, 2 + radial, 2 +  dorsalis pedis, normal symmetric pulses  bilaterally. HEENT normocephalic, oral  mucosa moist.          Assessments    ---    1\. 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 Mild mitral regurgitation - I34.0    ---    3\. LBBB (left bundle branch block) - I44.7    ---      In summary, Mrs Fobes is a 66 yo with history of NICM, LBBB and mild MR who  is NYHA class I for HFrEF. She has been stable over the past year and is  tolerating high doses of GDMT for HF with a stable LVEF of 35% and mild MR.  She does not have an ICD.    We have not yet performed an MRI and that could still be  a useful tool if  there is a change in her symptoms or cardiac structure, but there is currently  no clear indication. She has not yet received an ICD, in part because her LVEF  has been on the upper limit of eligibility and in part because she is NYHA  class I, but we should continue to assess this. Given her LBBB with QRS ,  she would be offered CRT-D if she became eligible for dvice therapy.    We made no HF medication changes today and will continue her current doses of  bisoprolol and Entresto. She is not on spironolatone, which we could consider  for the future. Labs are stable: Cr 0.75, K 4.6, A1c 6%, LDL 104, normal CBC.    Her TSH is now slighlty low at 0.28. I gave Mrs Egger verbal and written  instructions to decrease her levothyroxine doses to alternating / 100mg   dose.    ---      Follow Up    ---    6-12 Months    Electronically signed by Boris Lown MD on 02/07/2017 at 03:32 AM EDT    Sign off status: Completed        * * *        [facility]    [address]    [place], [place] [address]    Tel: [phone]    Fax: [phone]              * * *          Patient: XIAN, APOSTOL DOB: 1951-06-07 Progress Note: Jakyrah Holladay  01/30/2017    ---    Note generated by eClinicalWorks EMR/PM Software ([email or web address])

## 2017-01-30 NOTE — Progress Notes (Signed)
.  Progress Notes  .  Patient: Stephanie [name]  Provider: Webb Laws MD  .  DOB: May 08, 1951 Age: 65 Y Sex: Female  .  PCP: Unknown, Unknown    Date: 01/30/2017  .  --------------------------------------------------------------------------------  .  REASON FOR APPOINTMENT  .  1. CHF F/U APPT. / ECHO @ 1PM  .  HISTORY OF PRESENT ILLNESS  .  GENERAL:   We had the pleasure to see Stephanie [name] today in our clinic at  [facility]. Stephanie [name] is a 33 yo with history of HTN,  HLD and graves disease sp radioactive ablation who presents with  SOB on exertion. Stephanie [name]'s symptoms started in the early 57s.  At that time she developed SOB on exertion. She was able to walk  a block adn climb 20 steps before developing SOB. She denied  orthopnea and PND. She also complained of "chest burning" that  did not correlated with exertion. Given the SOB and the chest  burning, in 2008 she had LHC that did not show evidence of  coronary artery disease. She also underwent echocardiogram that  showed mild to moderate mitral valve regurgitation and LVEDD at  5.6 cm. She was started on atenolol and enalapril and she  continued following with her cardiologist periodically. In 2010  she was diagnosed with Graves disease and she received  radioactive iodine ablation. Her symptoms had been stable up to a  year ago when she started having worsening SOB on exertion. She  ended up having SOB on minimal exeartion but she never developed  PND, orthopnea or LE edema. She underwent work up in her home  country of [place], including a TEE that was read as severe mitral  regurgitation, for which she was referred to a Careers adviser for [facility].  However on surgical review, the MR was recognized to be less  severe and functional in nature. Her LV was more dilated compared  to 2008 (6.1 cm) and her EF had dropped to 40%. The patient was  started on bisoprolol, entresto and lasix about three months ago.  Stephanie [name] was last seen almost a year ago in 03/14/16.  She has  been back in the [place] visiting family this summer and so presents  for follow-u today 01/30/17. SHe has been very well in the  interim. Her husband has been undergoing cancer therapy and so  she has been busy supporting him but she denies any exercise  limitations. Can walk 25 mins comfortably. No dyspnea on  exertion, orthopnea, palps or dizziness. She is tolerating the  max Entresto dose very well. She also denies weight changes, LE  edema. She continues to have the same chest burning that she had  back in 2008 when her LHC was normal and had an EGD/colonscopy  locally recently with confirmation of gastritis as the chest pain  etiology.  .  CURRENT MEDICATIONS  .  Taking Aspirin 81 MG Tablet Chewable 1 tablet Orally Once a day  Taking Bisoprolol Fumarate 5 MG Tablet 2.5mg  only Orally Once a  day  Taking Celebrex - Capsule Arcoxia (non-FDA approved) 90mg  Orally  Once a day  Taking Entresto 97-103 MG Tablet 1 tablet Orally Twice a day  Taking Lasix 20 MG Tablet 1 tablet Orally Once a day  Taking Levothyroxine Sodium 50 mcg Tablet 6 days and  1 day Orally Once a day  Taking Lipitor 10 MG Tablet 1 tablet Orally Once a day  Taking Omeprazole 10 MG  Capsule Delayed Release 1 capsule Orally  Once a day  Medication List reviewed and reconciled with the patient  .  PAST MEDICAL HISTORY  .  MIld mitral regurgitation  HTN  HLD  OA  Graves disease sp radioactive ablation  NICM  LBBB  Gastritis  .  ALLERGIES  .  None  .  SURGICAL HISTORY  .  lumpectomy  .  FAMILY HISTORY  .  Mother is alive. She has CAD. Father is deceased. No history of  heart disease.  .  SOCIAL HISTORY  .  .  Tobaccohistory:Never smoked.  .  Caffeine: 3 cups daily.  .  Alcohol Denies.  .  Patient denies tobacco, ETOH, or IVDUTwo sons are doctors  Primarily resides in [place].  Marland Kitchen  HOSPITALIZATION/MAJOR DIAGNOSTIC PROCEDURE  .  No hospitalization for heart failure  .  REVIEW OF SYSTEMS  .  CardioVascular:  .  Neurologic    no numbness or  weakness, no visual disturbance, no  difficulty speaking, parasthesias . General/Constitutional    no  fever, chills, night sweats, sleep disturbance, headaches, no  change in appetite, no fatigue, no weight gain/loss .  Gastrointestinal    no melena, no hematochezia, hematemses, no  blood in stool, nausea, vomiting, diarrhea, abdominal pain .  Hematology    no easy bruising, no prolonged bleeding .  Peripheral Vascular    no claudication, no skin ulceration .  Endocrine    no polydipsia, polyphagia, hot and cold intolerance  . Skin    no rash . Cardiovascular    no chest pain at rest or  with exertion, no dyspnea at rest or with exertion, no  palpitations, no fluid accumulation in the legs/edema, no  increase in abnormal girth, no presyncope, no syncope, no  orthopnea, no paroxysmal nocturnal dyspnea . Respiratory    no  cough, wheezing, shortness of breath at rest, sore throat, upper  airway congestion, or hemoptysis . Musculoskeletal    no  arthraglias, myalgias, or muscle aches .  Marland Kitchen  VITAL SIGNS  .  98% RA, 62 bpm 121/6968.5kg = 151lbs.  Marland Kitchen  EXAMINATION  .  Cardiac Testing:  BJY:NWGNF rhythm. LBBB .  Echo:  .  01/30/17:  The left ventricle is mildly dilated. There is no thrombus. There  is normal left ventricular wall thickness. Left ventricular  systolic function is moderately reduced. Ejection Fraction = 35%.  The interventricular septum is asynchronous. Basal-mid septal  akinesis. The right ventricle is normal in size and function. The  left atrial size is normal. Right atrial size is normal. A  dilated inferior vena cava suggests increased right atrial  pressure. The mitral valve leaflets appear mildly thickened,.  There is moderate mitral annular calcification. There is mild  mitral regurgitation. The tricuspid valve is normal. There is  mild tricuspid regurgitation. Right ventricular systolic pressure  is normal. The aortic valve is trileaflet. The aortic valve opens  well. Mild aortic regurgitation. The  pulmonic valve is not well  seen, but is grossly normal. Trace pulmonic valvular  regurgitation. The aortic root is normal size. The ascending  aorta is normal size. There is no pericardial effusion.  .  03/14/16: The left ventricle is mildly dilated. There is no  thrombus. There is normal left ventricular wall thickness. Left  ventricular systolic function is moderately reduced.Ejection  Fraction = 35-40%. The interventricular septum is asynchronous.  Basal-mid septal akinesis. Mild mitral and aortic valve  regurgitation..  Cardiac Catheterization:LHC (2008): without evidence of  CAD per  patient report .  LabsTSH 2.36, normal renal function and chem7, normal CBC, normal  CK  .  Marland Kitchen  PHYSICAL EXAMINATION  .  General Examination:  General Appearance  well developed, well nourished, alert,  oriented, in no acute distress. Head  normocephalic. Eyes  sclera  non-icteric. Oral Cavity  mucosa moist. Neck/Thyroid  carotid  pulse normal, no carotid bruit, no jugular venous distension.  Skin  no rashes. Heart  S1, S2 regular, no murmurs, rub or  gallops. Lungs  clear to auscultation bilaterally with no  wheezing, no crackles, no rhonchi. Chest  no costochondral  tenderness. Abdomen  soft, non tender, non distended, bowel  sounds present, no abdominal or flank bruits, no organomegaly, no  prominent pulsation, no bruits. Extremities  no edema, no  clubbing, no cyanosis. Peripheral Pulses  2 + posterior tibial, 2  + radial, 2 + dorsalis pedis, normal symmetric pulses  bilaterally. HEENT  normocephalic, oral mucosa moist.  .  ASSESSMENTS  .  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  Mild mitral regurgitation - I34.0  .  LBBB (left bundle branch block) - I44.7  .  In summary, Stephanie [name] is a 66 yo with history of NICM, LBBB and  mild MR who is NYHA class I for HFrEF. She has been stable over  the past year and is tolerating high doses of GDMT for HF with a  stable LVEF of 35% and mild MR. She does not have an ICD. We  have  not yet performed an MRI and that could still be a useful tool if  there is a change in her symptoms or cardiac structure, but there  is currently no clear indication. She has not yet received an  ICD, in part because her LVEF has been on the upper limit of  eligibility and in part because she is NYHA class I, but we  should continue to assess this. Given her LBBB with QRS ,  she would be offered CRT-D if she became eligible for dvice  therapy. We made no HF medication changes today and will continue  her current doses of bisoprolol and Entresto. She is not on  spironolatone, which we could consider for the future. Labs are  stable: Cr 0.75, K 4.6, A1c 6%, LDL 104, normal CBC.Her TSH is  now slighlty low at 0.28. I gave Stephanie [name] verbal and written  instructions to decrease her levothyroxine doses to alternating  / 100mg  dose.  .  FOLLOW UP  .  6-12 Months  .  Electronically signed by Boris Lown MD on  02/07/2017 at 03:32 AM EDT  .  Document electronically signed by VEST, Dyanne Carrel MD  .

## 2017-01-31 LAB — HX DIABETES
HX GLUCOSE: 86 mg/dL (ref 70–139)
HX HEMOGLOBIN A1C: 6 % — ABNORMAL HIGH

## 2017-01-31 LAB — HX CHEM-METABOLIC
HX HEMOGLOBIN A1C: 6 % — ABNORMAL HIGH
HX THYROID STIMULATING HORMONE (TSH): 0.28 u[IU]/mL — ABNORMAL LOW (ref 0.35–4.94)

## 2017-02-01 ENCOUNTER — Ambulatory Visit

## 2017-05-14 ENCOUNTER — Ambulatory Visit

## 2017-06-01 IMAGING — CT CT ABD-PELV W/O CM
2 of 4 series · 16 of 46 positions shown, 18 images · non-contrast
Comparison: None.

CLINICAL DATA: Epigastric pain, dizziness, and nausea beginning 3
hours ago.

EXAM:
CT ABDOMEN AND PELVIS WITHOUT CONTRAST
TECHNIQUE: Multidetector CT imaging of the abdomen and pelvis was performed
following the standard protocol without IV contrast.

[Series 3: abd/pel w/o · axial · non-contrast · 0.80mm/px · z∈[+1022,+1448]mm · 13 of 99 slices shown, 15 images]
[im 7/99  soft-tissue]
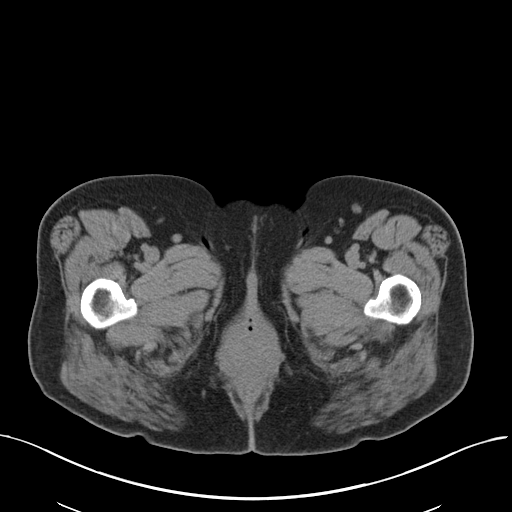
[im 7/99  bone]
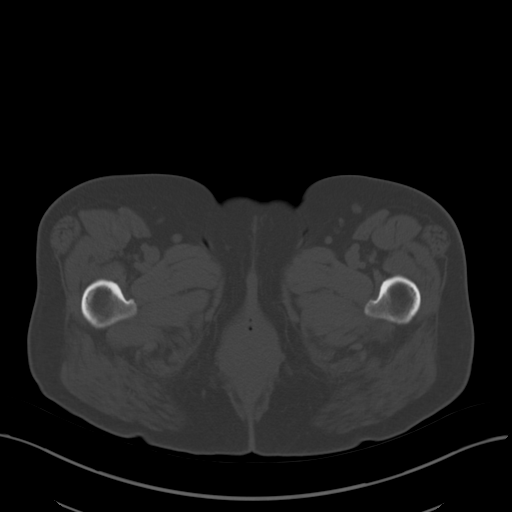
[im 13/99  soft-tissue]
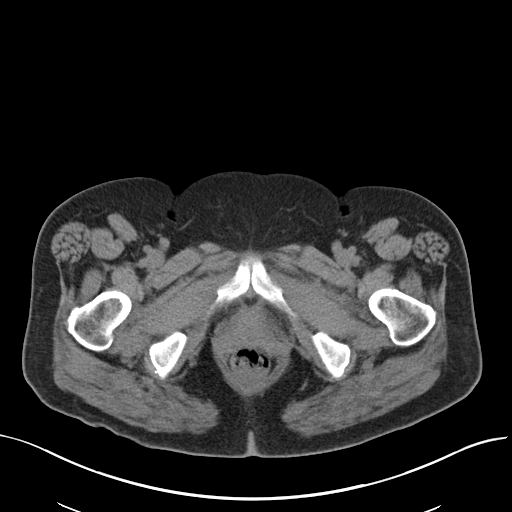
[im 19/99  soft-tissue]
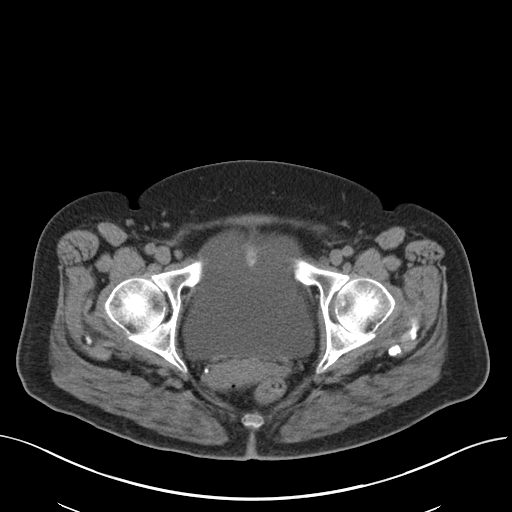
[im 31/99  soft-tissue]
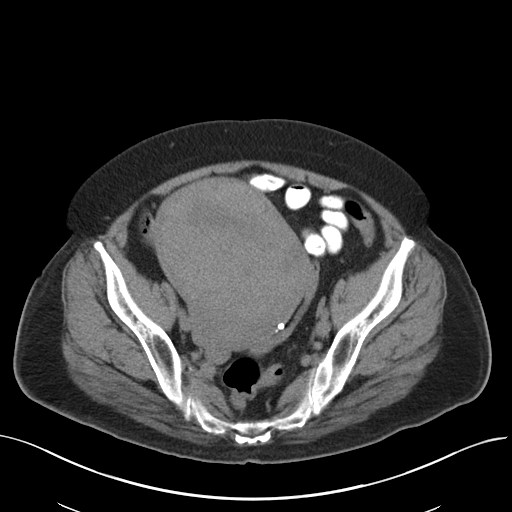
[im 37/99  soft-tissue]
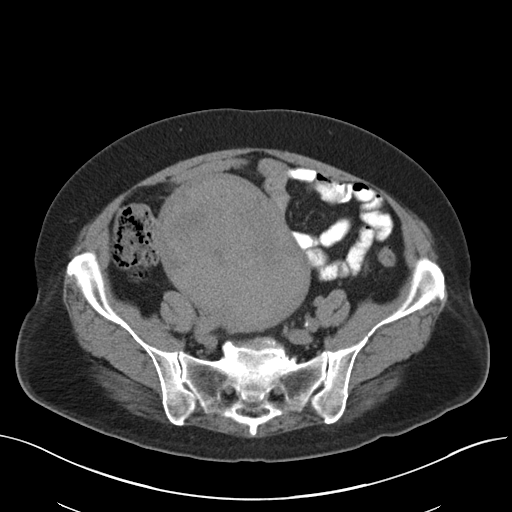
[im 43/99  soft-tissue]
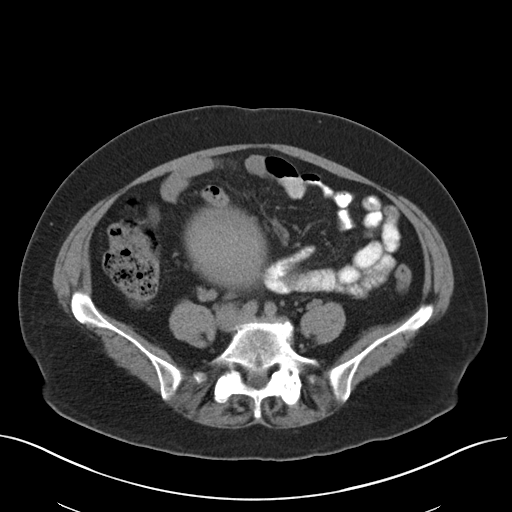
[im 50/99  soft-tissue]
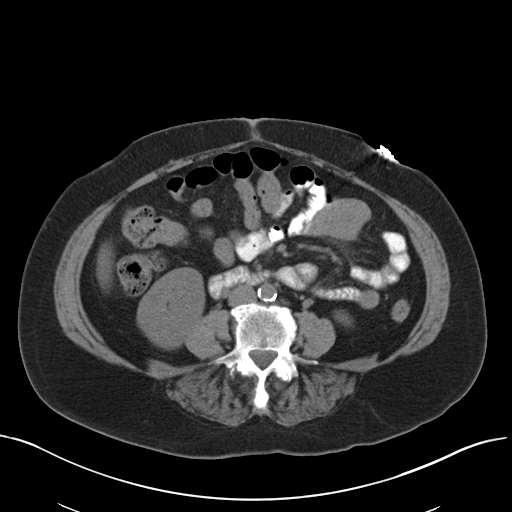
[im 56/99  soft-tissue]
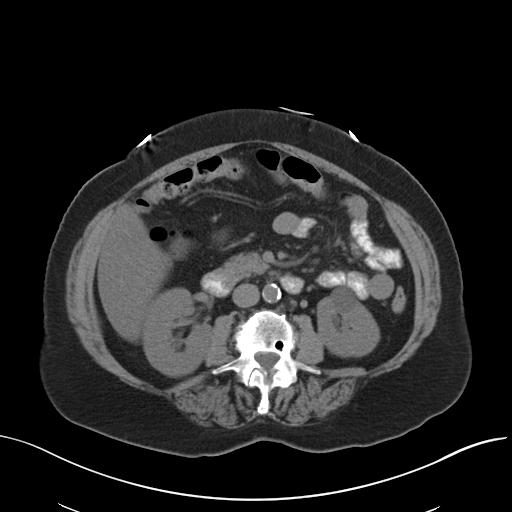
[im 62/99  soft-tissue]
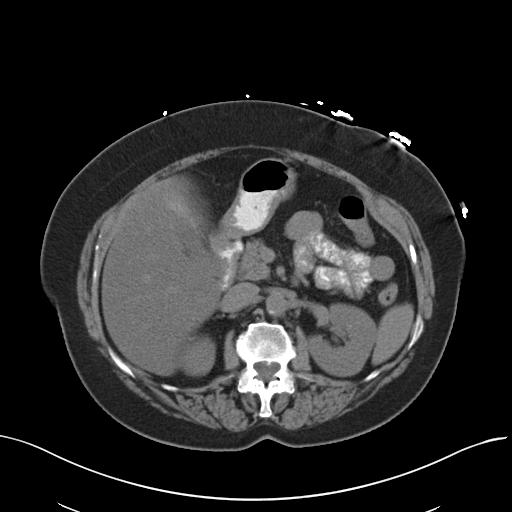
[im 62/99  bone]
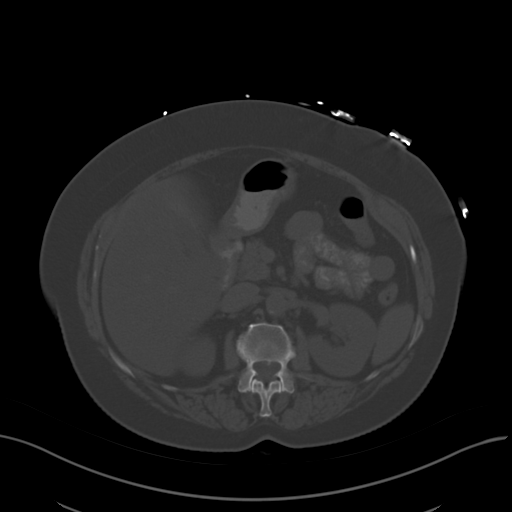
[im 68/99  soft-tissue]
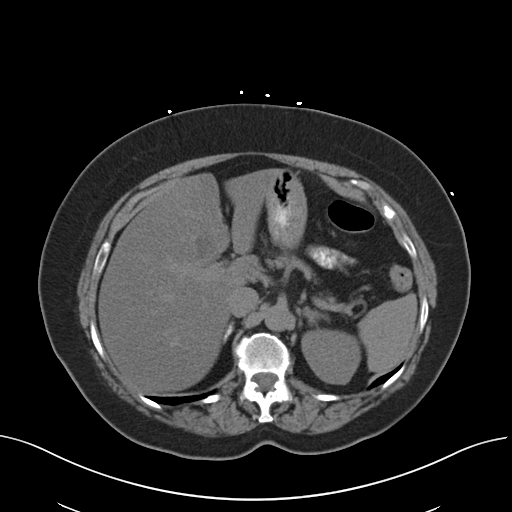
[im 80/99  soft-tissue]
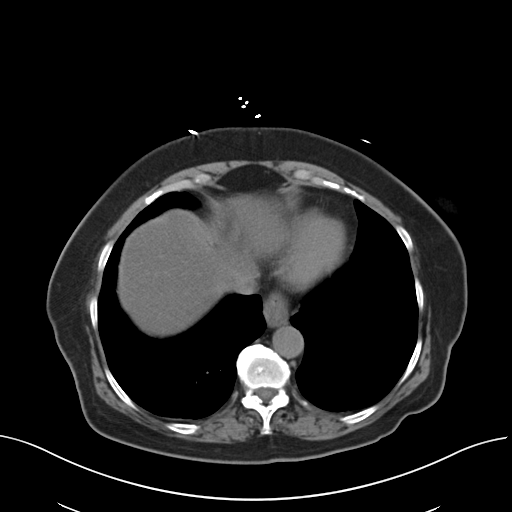
[im 86/99  soft-tissue]
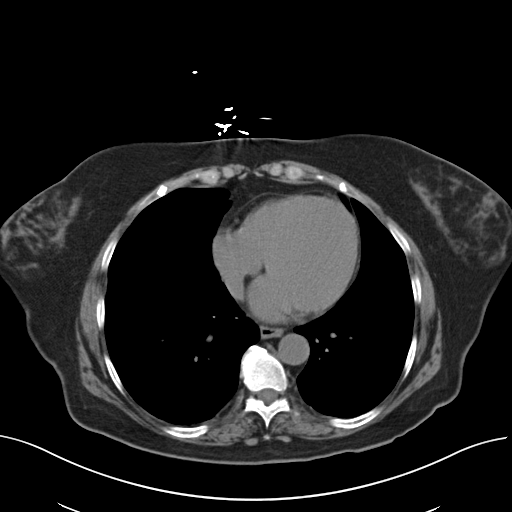
[im 92/99  soft-tissue]
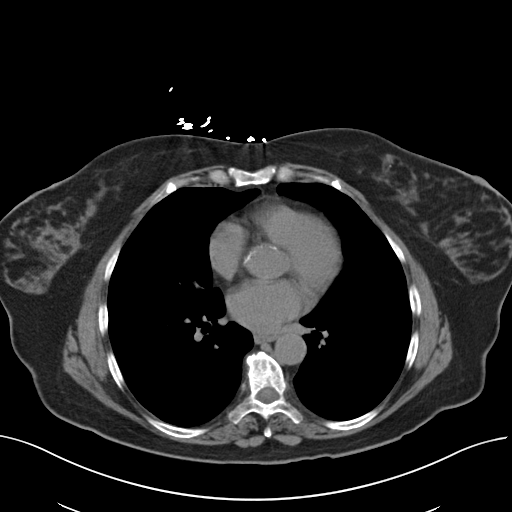

[Series 6: coronal · coronal · 0.78mm/px · 3 of 139 slices shown]
[im 47/139  soft-tissue]
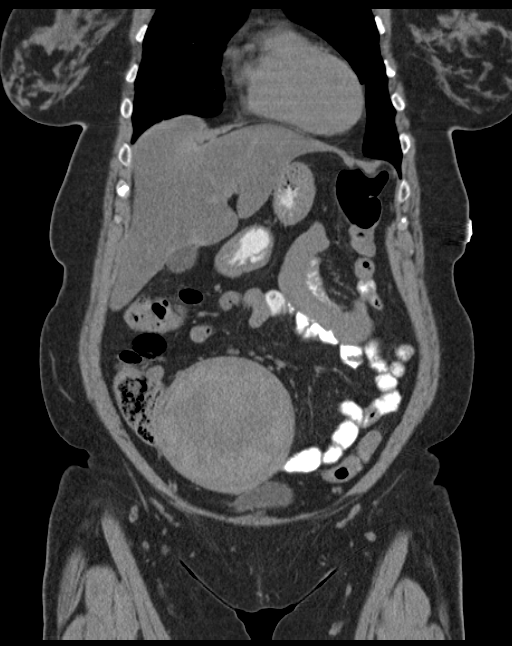
[im 62/139  soft-tissue]
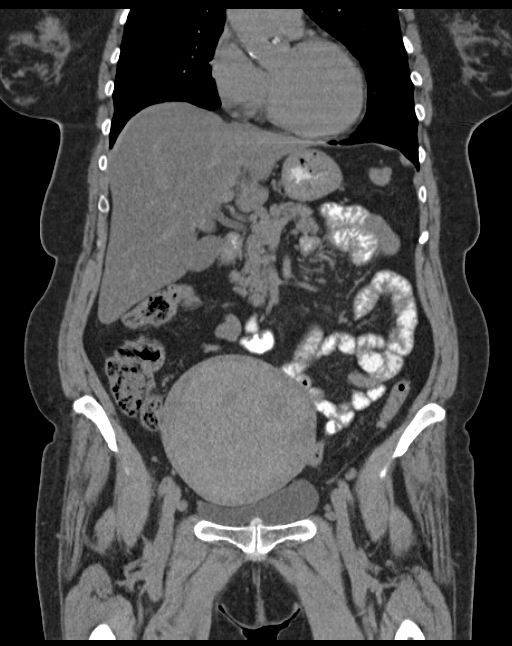
[im 77/139  soft-tissue]
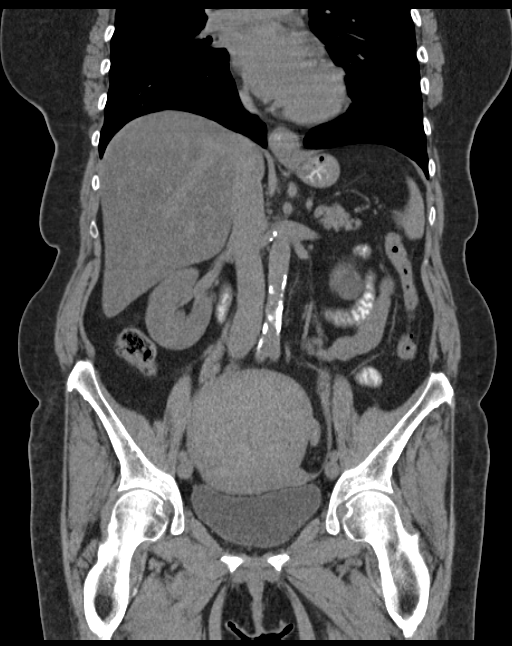

[16 of 46 positions shown; findings below may reference images not displayed]

FINDINGS: Lower chest: Calcified granulomas in the left lung base.
Calcification in the aortic valve. Coronary artery calcifications.
Small esophageal hiatal hernia.

Hepatobiliary: Diffuse fatty infiltration of the liver. Focal
low-attenuation adjacent to the gallbladder fossa is probably focal
fatty change. Gallbladder and bile ducts are unremarkable.

Pancreas: Unenhanced appearance is unremarkable.

Spleen: Unenhanced appearance is unremarkable.

Adrenals/Urinary Tract: Calcification in the right adrenal gland may
be represents a old hemorrhage or old infection. Cyst in the left
kidney. No hydronephrosis in either kidney. No renal or ureteral
stones. Bladder wall is not thickened.

Stomach/Bowel: Stomach is within normal limits. Appendix appears
normal. No evidence of bowel wall thickening, distention, or
inflammatory changes.

Vascular/Lymphatic: Aortic atherosclerosis. No enlarged abdominal or
pelvic lymph nodes.

Reproductive: There is a large uterine mass, likely fibroid,
measuring about 9.7 x 13.8 cm in diameter. No abnormal adnexal
masses.

Other: No abdominal wall hernia or abnormality. No abdominopelvic
ascites.

Musculoskeletal: Degenerative changes in the spine. No destructive
bone lesions. Slight anterior subluxation at L4-5.
IMPRESSION: Large uterine mass, likely a fibroid. Diffuse fatty infiltration of
the liver. No evidence of bowel obstruction or inflammation. Small
esophageal hiatal hernia.

## 2017-08-27 ENCOUNTER — Ambulatory Visit: Admitting: Internal Medicine

## 2017-08-27 NOTE — Progress Notes (Signed)
* * *        **  Stephanie [name]**    --- ---    74 Y old Female, DOB: 07-Feb-1951    [address], [place], [place] [id number]    Home: [phone]    Provider: Kaliel Bolds, Dyanne Carrel, MD        * * *    Telephone Encounter    ---    Answered by   Thierry Dobosz, Marchelle Folks R  Date: 08/27/2017         Time: 11:42 AM    Refills  Refill Entresto Tablet, 97-103 MG, Orally, 180, 1 tablet, Twice a  day, 90 days, Refills=4    --- ---          * * *                ---          * * *          PatientRoxan [name] DOB: Dec 23, 1950 Provider: Webb Laws, MD  08/27/2017    ---    Note generated by eClinicalWorks EMR/PM Software ([email or web address])

## 2018-02-12 ENCOUNTER — Ambulatory Visit: Admitting: Internal Medicine

## 2018-02-12 ENCOUNTER — Ambulatory Visit: Admitting: Cardiovascular Disease

## 2018-02-12 NOTE — Progress Notes (Signed)
* * *        Stephanie [name]    --- ---    56 Y old Female, DOB: 1951/04/07, External MRN: [id number]    Account Number: [id number]    [address], [place], [id number]    Home: [phone]    Insurance: MEDICARE    PCP: Unknown Unknown Referring: Unknown Unknown    Appointment Facility: Card[facility]        * * *    02/12/2018   **Appointment Provider:** Guy Begin* **CHN#:** [id number]    --- ---      **Supervising Provider:** AMANDA VEST    ---         **Reason for Appointment**    ---       1\. HF PT - ECHO @ 10AM    ---       **History of Present Illness**    ---     _GENERAL_ :    We had the pleasure to see Stephanie [name] today in our clinic at [facility]. Stephanie [name] is a 30 yo with history of HTN, HLD and Graves disease sp  radioactive ablation who presents toady for follow-up regarding her NICM,  chronic systolic HF and LBBB. She resides both in [place], where her primary  cardiologist is located, and the [place] where her sons live.    Stephanie [name]'s symptoms started in the early 79s. At that time she developed SOB  on exertion. She was able to walk a block and climb 20 steps before developing  SOB. She denied orthopnea and PND. She also complained of "chest burning" that  did not correlated with exertion. Given the SOB and the chest burning, in 2008  she had LHC that did not show evidence of coronary artery disease. She also  underwent echocardiogram that showed mild to moderate mitral valve  regurgitation and LVEDD at 5.6 cm. She was started on atenolol and enalapril  and she continued following with her cardiologist periodically. In 2010 she  was diagnosed with Graves disease and she received radioactive iodine  ablation.    Her symptoms had been stable up to two years ago when she started having  worsening SOB on exertion. She ended up having SOB on minimal exeartion but  she never developed PND, orthopnea or LE edema. She underwent work up in  [place], including a TEE that was read as  severe mitral regurgitation, for  which she was referred to a Careers adviser for MVr. However on surgical review, the  MR was recognized to be less severe and functional in nature. Her LV was more  dilated compared to 2008 (6.1 cm) and her EF had dropped to 40%. She was  started on bisoprolol, Entresto and furosemide and a surgery was not pursed.    Stephanie [name] was last seen almost a year ago in 01/2017. She is back in the [place]  visiting family this summer and so presents for follow-up today 02/12/18. She  has been very well in the interim. She denies any symptoms and has not been  hospitalized. She can walk 25 mins comfortably, but exertion beyond this is  limited by fatigue. No dyspnea on exertion, orthopnea, palpitations or  dizziness. She is tolerating the max Entresto dose very well. She also denies  weight changes, LE edema. Her repeat echocardiogram is stable compared to  [facility].     _Heart Failure_ :    NYHA Class    - _I-II_       **  Current Medications**    ---    Taking     * Aspirin 81 MG Tablet Chewable 1 tablet Orally Once a day    ---    * Bisoprolol Fumarate 5 MG Tablet 2.5mg  only Orally Once a day    ---    * Celebrex - Capsule Arcoxia (non-FDA approved) 90mg  Orally Once a day    ---    * Entresto 97-103 MG Tablet 1 tablet Orally Twice a day    ---    * Lasix 20 MG Tablet 1 tablet Orally Once a day    ---    * Levothyroxine Sodium 50 mcg Tablet 6 days and 1 day Orally Once a day    ---    * Lipitor 10 MG Tablet 1 tablet Orally Once a day    ---    * Omeprazole 10 MG Capsule Delayed Release 1 capsule Orally Once a day    ---    * Medication List reviewed and reconciled with the patient    ---       **Past Medical History**    ---       MIld mitral regurgitation.        ---    HTN.        ---    HLD.        ---    OA.        ---    Graves disease sp radioactive ablation.        ---    NICM.        ---    LBBB.        ---    Gastritis .        ---       **Surgical History**    ---       lumpectomy     ---      **Family History**    ---       Mother is alive. She has CAD. Father is deceased. No history of heart  disease.    ---       **Social History**    ---    Alcohol    _Denies_    Caffeine: 3 cups daily.    Tobacco    history: _Never smoked_   Patient denies tobacco, ETOH, or IVDU    Two sons are doctors    Primarily resides in [place].    ---       **Allergies**    ---       None    ---       **Hospitalization/Major Diagnostic Procedure**    ---       No hospitalization for heart failure    ---      **Review of Systems**    ---     _CardioVascular_ :    Neurologic no numbness or weakness, no visual disturbance, no difficulty  speaking, parasthesias. General/Constitutional no fever, chills, night sweats,  sleep disturbance, headaches, no change in appetite, no fatigue, no weight  gain/loss. Gastrointestinal no melena, no hematochezia, hematemses, no blood  in stool, nausea, vomiting, diarrhea, abdominal pain. Hematology no easy  bruising, no prolonged bleeding. Peripheral Vascular no claudication, no skin  ulceration. Endocrine no polydipsia, polyphagia, hot and cold intolerance.  Skin no rash. Cardiovascular no chest pain at rest or with exertion, no  dyspnea at rest or with exertion, no palpitations, no fluid accumulation in  the legs/edema, no increase  in abnormal girth, no presyncope, no syncope, no  orthopnea, no paroxysmal nocturnal dyspnea. Respiratory no cough, wheezing,  shortness of breath at rest, sore throat, upper airway congestion, or  hemoptysis. Musculoskeletal no arthraglias, myalgias, or muscle aches.          **Vital Signs**    ---    Pain scale 0, Ht-in 66, Wt-lbs 152, BMI 24.53, BP 124/64, HR 54, RR 16, BSA  1.79, O2 97, Ht-cm 167.64, Wt-kg 68.95, Wt Change -9 lb.       **Examination**    ---     _Cardiac Testing_ :    EKG:  Sinus rhythm. LBBB .    Echo:    01/2018: No change compare to prior    01/30/17:    The left ventricle is mildly dilated. There is no thrombus. There is normal  left  ventricular wall thickness. Left ventricular systolic function is  moderately reduced. Ejection Fraction = 35%. The interventricular septum is  asynchronous. Basal-mid septal akinesis. The right ventricle is normal in size  and function. The left atrial size is normal. Right atrial size is normal. A  dilated inferior vena cava suggests increased right atrial pressure. The  mitral valve leaflets appear mildly thickened,. There is moderate mitral  annular calcification. There is mild mitral regurgitation. The tricuspid valve  is normal. There is mild tricuspid regurgitation. Right ventricular systolic  pressure is normal. The aortic valve is trileaflet. The aortic valve opens  well. Mild aortic regurgitation. The pulmonic valve is not well seen, but is  grossly normal. Trace pulmonic valvular regurgitation. The aortic root is  normal size. The ascending aorta is normal size. There is no pericardial  effusion.        03/14/16: The left ventricle is mildly dilated. There is no thrombus. There is  normal left ventricular wall thickness. Left ventricular systolic function is  moderately reduced.Ejection Fraction = 35-40%. The interventricular septum is  asynchronous. Basal-mid septal akinesis. Mild mitral and aortic valve  regurgitation.    .    Cardiac Catheterization:  LHC (2008): without evidence of CAD per patient  report .    Labs January 27 2018 local labs: Cr 0.8, K 4.8, normal ALT and ALK, TSH 0.8, Hgb  14.3    A1c 6.0%        TSH 2.36, normal renal function and chem7, normal CBC, normal CK    .          **Physical Examination**    ---     _General Examination_ :    General Appearance well developed, well nourished, alert, oriented, in no  acute distress.    Head normocephalic.    Eyes sclera non-icteric.    Oral Cavity mucosa moist.    Neck/Thyroid carotid pulse normal, no carotid bruit, no jugular venous  distension.    Skin no rashes.    Heart S1, S2 regular, 1/6 PSM, no added sounds .    Lungs clear to  auscultation bilaterally with no wheezing, no crackles, no  rhonchi.    Chest no costochondral tenderness.    Abdomen soft, non tender, non distended, bowel sounds present, no abdominal or  flank bruits, no organomegaly, no prominent pulsation, no bruits.    Extremities no edema, no clubbing, no cyanosis.    Peripheral Pulses 2 + posterior tibial, 2 + radial, 2 + dorsalis pedis, normal  symmetric pulses bilaterally.    HEENT normocephalic, oral mucosa moist.          **  Assessments**    ---    1\. 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 Mild mitral regurgitation - I34.0    ---    3\. LBBB (left bundle branch block) - I44.7    ---      In summary, Stephanie [name] is a 55 yo with history of NICM, LBBB and mild MR who  is NYHA class I-II and euvolemic with chronic HFrEF. She has been stable over  the past year and is tolerating high doses of GDMT for HF with a stable LVEF  of 35% and mild MR. She does not have an ICD or CRT-D.    She has not yet received CRT-D, in part because her LVEF has been on the upper  limit of eligibility and in part because she is mostly NYHA class I, but we  discussed this option again in detail today. Given her LBBB with QRS , we  discussed that there may be scope for CRT-D to improve her LVEF and possibly  her functional capacity. We could perform a cardiac MRI and refer her for  consultation with our EP colleague Dr Ewell Poe while she is in the [place],  which she will consider and discuss with her family prior to making a  decision. Her sons (doctors) will also try to determine whether CRT-D or ICD  monitoring could be performed by her local cardiologist in [place].    We made no HF medication changes today and will continue her current excellent  doses of bisoprolol and Entresto. She is not on spironolatone, which is an  option that we could consider for the future. Labs from 2 weeks ago were good,  as above.    ---       **Follow Up**    ---    12 Months    **Appointment  Provider:** Gwenyth Bouillon [facility]*    Electronically signed by Boris Lown MD on 02/15/2018 at 10:07 PM EDT    Sign off status: Completed        * * *        Cardiac Subspecialty    [address]    [place], [address]    [place], [place] [address]    Tel: [phone]    Fax: [phone]              * * *          Patient: Stephanie [name], Stephanie [name] DOB: Aug 06, 1951 Progress Note: Guy Begin*  02/12/2018    ---    Note generated by eClinicalWorks EMR/PM Software ([email or web address])

## 2018-02-12 NOTE — Progress Notes (Signed)
.  Progress Notes  .  Patient: Stephanie [name]  Provider: Guy Begin  MD  .DOB: 1951/07/24 Age: 67 Y Sex: Female  Supervising Provider:: AMANDA VEST  Date: 02/12/2018  .  PCP: Unknown, Unknown    Date: 02/12/2018  .  --------------------------------------------------------------------------------  .  REASON FOR APPOINTMENT  .  1. HF PT - ECHO @ 10AM  .  HISTORY OF PRESENT ILLNESS  .  GENERAL:   We had the pleasure to see Mrs Kantor today in our clinic at  [facility]. Mrs Mozingo is a 66 yo with history of HTN,  HLD and Graves disease sp radioactive ablation who presents toady  for follow-up regarding her NICM, chronic systolic HF and LBBB.  She resides both in [place], where her primary cardiologist is  located, and the [place] where her sons live. Mrs Yaworski's symptoms  started in the early 33s. At that time she developed SOB on  exertion. She was able to walk a block and climb 20 steps before  developing SOB. She denied orthopnea and PND. She also complained  of "chest burning" that did not correlated with exertion. Given  the SOB and the chest burning, in 2008 she had LHC that did not  show evidence of coronary artery disease. She also underwent  echocardiogram that showed mild to moderate mitral valve  regurgitation and LVEDD at 5.6 cm. She was started on atenolol  and enalapril and she continued following with her cardiologist  periodically. In 2010 she was diagnosed with Graves disease and  she received radioactive iodine ablation. Her symptoms had been  stable up to two years ago when she started having worsening SOB  on exertion. She ended up having SOB on minimal exeartion but she  never developed PND, orthopnea or LE edema. She underwent work up  in [place], including a TEE that was read as severe mitral  regurgitation, for which she was referred to a Careers adviser for [facility].  However on surgical review, the MR was recognized to be less  severe and functional in nature. Her LV was more dilated  compared  to 2008 (6.1 cm) and her EF had dropped to 40%. She was started  on bisoprolol, Entresto and furosemide and a surgery was not  pursed. Mrs Willenbring was last seen almost a year ago in 01/2017. She  is back in the [place] visiting family this summer and so presents for  follow-up today 02/12/18. She has been very well in the interim.  She denies any symptoms and has not been hospitalized. She can  walk 25 mins comfortably, but exertion beyond this is limited by  fatigue. No dyspnea on exertion, orthopnea, palpitations or  dizziness. She is tolerating the max Entresto dose very well. She  also denies weight changes, LE edema. Her repeat echocardiogram  is stable compared to priors.  .  Heart Failure:  NYHA Class  .  .  - I-II  .  CURRENT MEDICATIONS  .  Taking Aspirin 81 MG Tablet Chewable 1 tablet Orally Once a day  Taking Bisoprolol Fumarate 5 MG Tablet 2.5mg  only Orally Once a  day  Taking Celebrex - Capsule Arcoxia (non-FDA approved) 90mg  Orally  Once a day  Taking Entresto 97-103 MG Tablet 1 tablet Orally Twice a day  Taking Lasix 20 MG Tablet 1 tablet Orally Once a day  Taking Levothyroxine Sodium 50 mcg Tablet 6 days and  1 day Orally Once a day  Taking Lipitor 10 MG Tablet  1 tablet Orally Once a day  Taking Omeprazole 10 MG Capsule Delayed Release 1 capsule Orally  Once a day  Medication List reviewed and reconciled with the patient  .  PAST MEDICAL HISTORY  .  MIld mitral regurgitation  HTN  HLD  OA  Graves disease sp radioactive ablation  NICM  LBBB  Gastritis  .  ALLERGIES  .  None  .  SURGICAL HISTORY  .  lumpectomy  .  FAMILY HISTORY  .  Mother is alive. She has CAD. Father is deceased. No history of  heart disease.  .  SOCIAL HISTORY  .  Marland Kitchen  Alcohol  Denies  .  Marland Kitchen  Caffeine: 3 cups daily.  .  .  Tobacco  history:Never smoked  .  Patient denies tobacco, ETOH, or IVDUTwo sons are doctors  Primarily resides in [place].  Marland Kitchen  HOSPITALIZATION/MAJOR DIAGNOSTIC PROCEDURE  .  No hospitalization for heart  failure  .  REVIEW OF SYSTEMS  .  CardioVascular:  .  Neurologic    no numbness or weakness, no visual disturbance, no  difficulty speaking, parasthesias . General/Constitutional    no  fever, chills, night sweats, sleep disturbance, headaches, no  change in appetite, no fatigue, no weight gain/loss .  Gastrointestinal    no melena, no hematochezia, hematemses, no  blood in stool, nausea, vomiting, diarrhea, abdominal pain .  Hematology    no easy bruising, no prolonged bleeding .  Peripheral Vascular    no claudication, no skin ulceration .  Endocrine    no polydipsia, polyphagia, hot and cold intolerance  . Skin    no rash . Cardiovascular    no chest pain at rest or  with exertion, no dyspnea at rest or with exertion, no  palpitations, no fluid accumulation in the legs/edema, no  increase in abnormal girth, no presyncope, no syncope, no  orthopnea, no paroxysmal nocturnal dyspnea . Respiratory    no  cough, wheezing, shortness of breath at rest, sore throat, upper  airway congestion, or hemoptysis . Musculoskeletal    no  arthraglias, myalgias, or muscle aches .  Marland Kitchen  VITAL SIGNS  .  Pain scale 0, Ht-in 66, Wt-lbs 152, BMI 24.53, BP 124/64, HR 54,  RR 16, BSA 1.79, O2 97, Ht-cm 167.64, Wt-kg 68.95, Wt Change -9  lb.  Marland Kitchen  EXAMINATION  .  Cardiac Testing:  EKG: Sinus rhythm. LBBB .  Marland Kitchen  Echo:  .  01/2018: No change compare to prior  01/30/17:  The left ventricle is mildly dilated. There is no thrombus. There  is normal left ventricular wall thickness. Left ventricular  systolic function is moderately reduced. Ejection Fraction = 35%.  The interventricular septum is asynchronous. Basal-mid septal  akinesis. The right ventricle is normal in size and function. The  left atrial size is normal. Right atrial size is normal. A  dilated inferior vena cava suggests increased right atrial  pressure. The mitral valve leaflets appear mildly thickened,.  There is moderate mitral annular calcification. There is mild  mitral  regurgitation. The tricuspid valve is normal. There is  mild tricuspid regurgitation. Right ventricular systolic pressure  is normal. The aortic valve is trileaflet. The aortic valve opens  well. Mild aortic regurgitation. The pulmonic valve is not well  seen, but is grossly normal. Trace pulmonic valvular  regurgitation. The aortic root is normal size. The ascending  aorta is normal size. There is no pericardial effusion.  .  03/14/16: The left ventricle  is mildly dilated. There is no  thrombus. There is normal left ventricular wall thickness. Left  ventricular systolic function is moderately reduced.Ejection  Fraction = 35-40%. The interventricular septum is asynchronous.  Basal-mid septal akinesis. Mild mitral and aortic valve  regurgitation.  .  .  .  Cardiac Catheterization: LHC (2008): without evidence of CAD per  patient report .  Marland Kitchen  LabsJune 10 2019 local labs: Cr 0.8, K 4.8, normal ALT and ALK,  TSH 0.8, Hgb 14.3  A1c 6.0%  .  TSH 2.36, normal renal function and chem7, normal CBC, normal CK  .  Marland Kitchen  PHYSICAL EXAMINATION  .  General Examination:  General Appearance  well developed, well nourished, alert,  oriented, in no acute distress.  Head  normocephalic.  Eyes  sclera non-icteric.  Oral Cavity  mucosa moist.  Neck/Thyroid  carotid pulse normal, no carotid bruit, no jugular  venous distension.  Skin  no rashes.  Heart  S1, S2 regular, 1/6 PSM, no added sounds .  Lungs  clear to auscultation bilaterally with no wheezing, no  crackles, no rhonchi.  Chest  no costochondral tenderness.  Abdomen  soft, non tender, non distended, bowel sounds present,  no abdominal or flank bruits, no organomegaly, no prominent  pulsation, no bruits.  Extremities  no edema, no clubbing, no cyanosis.  Peripheral Pulses  2 + posterior tibial, 2 + radial, 2 + dorsalis  pedis, normal symmetric pulses bilaterally.  HEENT  normocephalic, oral mucosa moist.  .  ASSESSMENTS  .  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  Mild mitral  regurgitation - I34.0  .  LBBB (left bundle branch block) - I44.7  .  In summary, Mrs Stoecker is a 67 yo with history of NICM, LBBB and  mild MR who is NYHA class I-II and euvolemic with chronic HFrEF.  She has been stable over the past year and is tolerating high  doses of GDMT for HF with a stable LVEF of 35% and mild MR. She  does not have an ICD or CRT-D. She has not yet received CRT-D, in  part because her LVEF has been on the upper limit of eligibility  and in part because she is mostly NYHA class I, but we discussed  this option again in detail today. Given her LBBB with QRS ,  we discussed that there may be scope for CRT-D to improve her  LVEF and possibly her functional capacity. We could perform a  cardiac MRI and refer her for consultation with our EP colleague  Dr Ewell Poe while she is in the [place], which she will consider  and discuss with her family prior to making a decision. Her sons  (doctors) will also try to determine whether CRT-D or ICD  monitoring could be performed by her local cardiologist in  [place]. We made no HF medication changes today and will continue  her current excellent doses of bisoprolol and Entresto. She is  not on spironolatone, which is an option that we could consider  for the future. Labs from 2 weeks ago were good, as above.  .  FOLLOW UP  .  12 Months  .  Marland Kitchen  Appointment Provider: Guy Begin*  .  Electronically signed by Boris Lown MD on  02/15/2018 at 10:07 PM EDT  .  CONFIRMATORY SIGN OFF  I personally interviewed and examined the patient and both the fellow (Dr Guy Begin) and I contributed to this electronic note. I agree with the  history, exam, assessment and plan as detailed in this note and edited it as necessary.   .  Document electronically signed by Guy Begin  MD  .

## 2018-02-25 ENCOUNTER — Ambulatory Visit

## 2018-03-03 ENCOUNTER — Ambulatory Visit: Admitting: Cardiovascular Disease

## 2018-03-03 NOTE — Progress Notes (Signed)
* * *        Stephanie [name]**    --- ---    78 Y old Female, DOB: [birth year], External MRN: [id number]    Account Number: [id number]    [address], [place], [id number]    Home: [phone]    Insurance: MEDICARE Payer ID: PAPER    PCP: Unknown Unknown Referring: Unknown Unknown External Visit ID: [id number]    Appointment Facility: [facility]        * * *    03/03/2018  Progress Notes: Guerry Minors, MD **CHN#:** [id number]    --- ---    ---         **Current Medications**    ---    Taking     * Aspirin 81 MG Tablet Chewable 1 tablet Orally Once a day    ---    * Bisoprolol Fumarate 5 MG Tablet 2.5mg  only Orally Once a day    ---    * Celebrex - Capsule Arcoxia (non-FDA approved) 90mg  Orally Once a day    ---    * Entresto 97-103 MG Tablet 1 tablet Orally Twice a day    ---    * Lasix 20 MG Tablet 1 tablet Orally Once a day    ---    * Levothyroxine Sodium 50 mcg Tablet 6 days and 1 day Orally Once a day    ---    * Lipitor 10 MG Tablet 1 tablet Orally Once a day    ---    * Omeprazole 10 MG Capsule Delayed Release 1 capsule Orally Once a day    ---      Past Medical History    ---       MIld mitral regurgitation.        ---    HTN.        ---    HLD.        ---    OA.        ---    Graves disease sp radioactive ablation 2010.Marland Kitchen        ---    NICM.        ---    LBBB.        ---    Gastritis .        ---       **Surgical History**    ---       lumpectomy    ---      **Family History**    ---       2 son(s) , 1 daughter(s) - healthy.    ---    Mother is alive. She has CAD. Father is deceased. No history of heart disease.    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    Alcohol  Denies.    Caffeine: 3 cups daily.    Marital Status  Married.    Tobacco history: Never smoked.   Patient denies tobacco, ETOH, or IVDU    Two sons are doctors    Primarily resides in [place].    ---       **Allergies**    ---       None    ---    [Allergies  Verified]       **Hospitalization/Major Diagnostic Procedure**    ---       No hospitalization for heart failure    ---      **  Review of Systems**    ---     _Cardiology - [facility]_ :    Constitutional no weight gain or loss in the past year, no night sweats or  fevers, no loss of appetite, no feelings of fatigue. Gastrointestinal no  swallowing difficulty, no nausea or vomiting, no constipation, no diarrhea, no  black stool, no blood in stool. Genital/Urinary no pain, burning, or  difficulty with urination, no increase in urinary frequency, no blood in  urine. Musculoskeletal \+ joint pain or swelling, + back pain, no arm or leg  weakness. Neurology/Psychiatry no dizziness or lightheadedness, no unsteady  gait, no numbness or tingling sensation, no headaches, no forgetfulness, no  anxious or nervous feelings, no insomnia, no feeling of being too sleepy all  the time.            **Reason for Appointment**    ---       1\. Cardiomyopathy    ---       **History of Present Illness**    ---     _Heart Failure_ :    NYHA Class \- I-II.    _Cardiology_ :    Thank you for referring [name] for evaluation of her candidacy for  a CRT-D. I had the pleasure to meet Ms. Basques at the [facility] on Monday, March 03, 2018 in the company of her husband and  son, a physician. As you recall, Ms. Reliford is a 67 yo female patient who has  had a long-standing history of exertional dyspnea and chest discomfort. She  splits her time between her home in [place], [place] and here in the  [place] where her son lives in [place] and her daughter in [place].    She was diagnosed with a left bundle branch block, cardiac catheterization in  2008 failed to demonstrate any occlusive disease. At some point she was  diagnosed with mitral regurgitation and was in fact referred for mitral valve  repair/replacement before the cardiothoracic surgeon determined that her  mitral regurgitation was  functional due to her underlying cardiomyopathy. She  was started on atenolol and enalapril and she continued following with her  cardiologist periodically. In 2017 she had an LVEF 35-40% with a mildly  dilated left ventricle and mild mitral regurgitation.    Over the course of the last 2 years, her medical therapy has been optimized.  She can walk 25 mins comfortably, but exertion beyond this is limited by  fatigue. No dyspnea on exertion, orthopnea, palpitations or dizziness. She  denies any history of presyncope or syncope. She is tolerating the max  Entresto dose very well. She also denies weight changes, LE edema. Her most  recent echocardiogram on February 12, 2018 showed a normal-sized left ventricular  cavity with an LVEF 35% and mild mitral regurgitation. No significant change  was found. When compared to its predecessor on January 30, 2017.       **Vital Signs**    ---    Pain scale 5, Ht-in 62, Wt-lbs 147, BMI 26.88, BP 128/72, HR 68, SaO2 98%.       **Examination**    ---     _Cardiac Testing_ :    EKG: (02/12/2018): Sinus rhythm, LBBB, QRS 160 ms.    Echo: (02/12/18): The left ventricle is normal in size. There is no thrombus.  There is normal left ventricular wall thickness. Left ventricular systolic  function is moderately reduced. Ejection Fraction =  35%. There is moderate  global hypokinesis of the left ventricle. The interventricular septum is  asynchronous. Basal-mid septum severely hypokinetic. The right ventricle is  normal in size and function. The left atrium is mildly dilated. Right atrial  size is normal. The inferior vena cava is normal size with > 50% respirophasic  variation, consistent with normal right atrial pressure. The mitral valve  leaflets appear mildly thickened,. There is moderate mitral annular  calcification. There is mild mitral regurgitation. The tricuspid valve  leaflets are thin and pliable. There is trace tricuspid regurgitation. Right  ventricular systolic pressure is normal. The  aortic valve is trileaflet. The  aortic valve opens well. Mild aortic regurgitatio. The pulmonic valve is not  well seen, but is grossly normal. Trace pulmonic valvular regurgitation. The  aortic root is normal size. The ascending aorta is normal size. There is no  pericardial effusion.    Cardiac Catheterization:  LHC (2008): without evidence of CAD per patient  report .          **Physical Examination**    ---     _Exam_ :    General no apparent distress. HEENT PERRLA, EOMI, no JVD, carotid pulses 2+  bilaterally, no bruits. Cardiovascular regular rate and rhythm, clear S1 and  S2, no murmurs, no gallops, no rubs, no heaves, no thrills. Pulmonary clear to  auscultation and percussion, no wheezing, no rhonchi, no rales. Abdomen soft,  non tender, non distended, no organomegaly. Extremities warm, well perfused,  no edema, no clubbing, no cyanosis. Neurologic non focal.          **Assessments**    ---    1\. 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 Mild mitral regurgitation - I34.0    ---    3\. LBBB (left bundle branch block) - I44.7    ---      Mrs. Broussard is a 68 year old female patient with a nonischemic  cardiomyopathy and a left bundle branch block with a QRS width of 160 ms. and  an LVEF 35%. It is difficult to determine the extent to which a CRT-D would  confer symptomatic benefit and/or protection from sudden cardiac death based  on the paucity of symptoms. It is unclear as to whether she is functionally  impaired by her cardiomyopathy or by her diffuse arthritis. Based on the  "ACCF/HRS/AHA/ASE/HFSA/SCAI/SCCT/SCMR 2013 Appropriate Use Criteria for  Implantable Cardioverter-Defibrillators and Cardiac Resynchronization Therapy"  (Heart Rhythm, Vol 10, No 4, April 2013), if we believe that she is completely  asymptomatic, i.e. NYHA class I the implantation of a CRT-D is considered "may  be appropriate" and is allocated a score of 6/10. If, on the other hand, she  is considered a NYHA class  II and then she falls under the "appropriate" use  category with a score of 8/10. I reviewed this information with Mrs. Lemma  and she seemed to be in favor of proceeding with an implant more out of fear  of overlooking the risk of SCD. I offered, if she were to remain undetermined,  a two-week monitor looking for any evidence of ventricular tachyarrhythmias.  However, she preferred to further deliberate the option of proceeding now or  upon her return in the winter or next summer with an implant, with her family.  Her family expressed concern about the availability of followup for a CRT-D  either in [place] or in [place], [place]. I offered to help in finding  appropriate followup for  her locally. Mrs. Mayson would like to deliberate  this further with her family including her 2 sons, one of whom is a  cardiologist in [place] before making a final determination.    ---       **Treatment**    ---       **1\. Non-ischemic cardiomyopathy**    Continue Bisoprolol Fumarate Tablet, 5 MG, 2.5mg  only, Orally, Once a day    Continue Entresto Tablet, 97-103 MG, 1 tablet, Orally, Twice a day    Continue Lasix Tablet, 20 MG, 1 tablet, Orally, Once a day    ---       **Follow Up**    ---    prn    Electronically signed by Guerry Minors , MD on 03/07/2018 at 07:22 PM EDT    Sign off status: Completed        * * *        [facility]    [address]    [place], [address]    [place], [place] [address]    Tel: [phone]    Fax: [phone]              * * *          Patient: Stephanie [name], Stephanie [name] DOB: [birth year] Progress Note: Guerry Minors, MD  03/03/2018    ---    Note generated by eClinicalWorks EMR/PM Software ([email or web address])

## 2018-03-03 NOTE — Progress Notes (Signed)
.  Progress Notes  .  Patient: Stephanie [name]  Provider: Guerry Minors    .  DOB: February 23, 1951 Age: 67 Y Sex: Female  .  PCP: Unknown, Unknown    Date: 03/03/2018  .  --------------------------------------------------------------------------------  .  REASON FOR APPOINTMENT  .  1. Cardiomyopathy  .  HISTORY OF PRESENT ILLNESS  .  Heart Failure:  NYHA Class  .  - I-II  .  Marland Kitchen  Cardiology:  Thank you for referring [name] for evaluation of her candidacy for a CRT-D. I had the  pleasure to meet Ms. Covey at the [facility] on Monday, March 03, 2018 in the company of her husband and  son, a physician. As you recall, Ms. Silverio is a 67 yo female  patient who has had a long-standing history of exertional dyspnea  and chest discomfort. She splits her time between her home in  [place], [place] and here in the [place] where her son  lives in [place] and her daughter in [place].She was  diagnosed with a left bundle branch block, cardiac  catheterization in 2008 failed to demonstrate any occlusive  disease. At some point she was diagnosed with mitral  regurgitation and was in fact referred for mitral valve  repair/replacement before the cardiothoracic surgeon determined  that her mitral regurgitation was functional due to her  underlying cardiomyopathy. She was started on atenolol and  enalapril and she continued following with her cardiologist  periodically. In 2017 she had an LVEF 35-40% with a mildly  dilated left ventricle and mild mitral regurgitation.Over the  course of the last 2 years, her medical therapy has been  optimized. She can walk 25 mins comfortably, but exertion beyond  this is limited by fatigue. No dyspnea on exertion, orthopnea,  palpitations or dizziness. She denies any history of presyncope  or syncope. She is tolerating the max Entresto dose very well.  She also denies weight changes, LE edema. Her most recent  echocardiogram on February 12, 2018  showed a normal-sized left  ventricular cavity with an LVEF 35% and mild mitral  regurgitation. No significant change was found. When compared to  its predecessor on January 30, 2017.  Marland Kitchen  CURRENT MEDICATIONS  .  Taking Aspirin 81 MG Tablet Chewable 1 tablet Orally Once a day  Taking Bisoprolol Fumarate 5 MG Tablet 2.5mg  only Orally Once a  day  Taking Celebrex - Capsule Arcoxia (non-FDA approved) 90mg  Orally  Once a day  Taking Entresto 97-103 MG Tablet 1 tablet Orally Twice a day  Taking Lasix 20 MG Tablet 1 tablet Orally Once a day  Taking Levothyroxine Sodium 50 mcg Tablet 6 days and  1 day Orally Once a day  Taking Lipitor 10 MG Tablet 1 tablet Orally Once a day  Taking Omeprazole 10 MG Capsule Delayed Release 1 capsule Orally  Once a day  .  PAST MEDICAL HISTORY  .  MIld mitral regurgitation  HTN  HLD  OA  Graves disease sp radioactive ablation 2010.  NICM  LBBB  Gastritis  .  ALLERGIES  .  None  .  SURGICAL HISTORY  .  lumpectomy  .  FAMILY HISTORY  .  2 son(s) , 1 daughter(s) - healthy.  Mother is alive. She has CAD. Father is deceased. No history of  heart disease.  .  SOCIAL HISTORY  .  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  Marland Kitchen  Alcohol Denies.  .  Caffeine: 3 cups daily.  .  Marital Status Married.  .  Tobaccohistory:Never smoked.  .  Patient denies tobacco, ETOH, or IVDUTwo sons are doctors  Primarily resides in [place].  Marland Kitchen  HOSPITALIZATION/MAJOR DIAGNOSTIC PROCEDURE  .  No hospitalization for heart failure  .  REVIEW OF SYSTEMS  .  Cardiology - [facility]:  .  Constitutional    no weight gain or loss in the past year, no  night sweats or fevers, no loss of appetite, no feelings of  fatigue . Gastrointestinal    no swallowing difficulty, no nausea  or vomiting, no constipation, no diarrhea, no black stool, no  blood in stool . Genital/Urinary    no pain, burning, or  difficulty with urination, no increase in urinary frequency, no  blood in urine . Musculoskeletal    + joint pain or  swelling, +  back pain, no arm or leg weakness . Neurology/Psychiatry    no  dizziness or lightheadedness, no unsteady gait, no numbness or  tingling sensation, no headaches, no forgetfulness, no anxious or  nervous feelings, no insomnia, no feeling of being too sleepy all  the time .  Marland Kitchen  VITAL SIGNS  .  Pain scale 5, Ht-in 62, Wt-lbs 147, BMI 26.88, BP 128/72, HR 68,  SaO2 98%.  Marland Kitchen  EXAMINATION  .  Cardiac Testing:  EKG:(02/12/2018): Sinus rhythm, LBBB, QRS 160 ms.  Echo:(02/12/18): The left ventricle is normal in size. There is no  thrombus. There is normal left ventricular wall thickness. Left  ventricular systolic function is moderately reduced. Ejection  Fraction = 35%. There is moderate global hypokinesis of the left  ventricle. The interventricular septum is asynchronous. Basal-mid  septum severely hypokinetic. The right ventricle is normal in  size and function. The left atrium is mildly dilated. Right  atrial size is normal. The inferior vena cava is normal size with  > 50% respirophasic variation, consistent with normal right  atrial pressure. The mitral valve leaflets appear mildly  thickened,. There is moderate mitral annular calcification. There  is mild mitral regurgitation. The tricuspid valve leaflets are  thin and pliable. There is trace tricuspid regurgitation. Right  ventricular systolic pressure is normal. The aortic valve is  trileaflet. The aortic valve opens well. Mild aortic  regurgitatio. The pulmonic valve is not well seen, but is grossly  normal. Trace pulmonic valvular regurgitation. The aortic root is  normal size. The ascending aorta is normal size. There is no  pericardial effusion.  Cardiac Catheterization: LHC (2008): without evidence of CAD per  patient report .  Marland Kitchen  PHYSICAL EXAMINATION  .  Exam:  General  no apparent distress. HEENT  PERRLA, EOMI, no JVD,  carotid pulses 2+ bilaterally, no bruits. Cardiovascular  regular  rate and rhythm, clear S1 and S2, no murmurs, no gallops,  no  rubs, no heaves, no thrills. Pulmonary  clear to auscultation and  percussion, no wheezing, no rhonchi, no rales. Abdomen  soft, non  tender, non distended, no organomegaly. Extremities  warm, well  perfused, no edema, no clubbing, no cyanosis. Neurologic  non  focal.  .  ASSESSMENTS  .  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  Mild mitral regurgitation - I34.0  .  LBBB (left bundle branch block) - I44.7  .  Mrs. Martus is a 67 year old female patient with a nonischemic  cardiomyopathy and a left bundle branch block with a QRS width of  160 ms. and an LVEF  35%. It is difficult to determine the extent  to which a CRT-D would confer symptomatic benefit and/or  protection from sudden cardiac death based on the paucity of  symptoms. It is unclear as to whether she is functionally  impaired by her cardiomyopathy or by her diffuse arthritis. Based  on the "ACCF/HRS/AHA/ASE/HFSA/SCAI/SCCT/SCMR 2013 Appropriate Use  Criteria for Implantable Cardioverter-Defibrillators and Cardiac  Resynchronization Therapy" (Heart Rhythm, Vol 10, No 4, April  2013), if we believe that she is completely asymptomatic, i.e.  NYHA class I the implantation of a CRT-D is considered "may be  appropriate" and is allocated a score of 6/10. If, on the other  hand, she is considered a NYHA class II and then she falls under  the "appropriate" use category with a score of 8/10. I reviewed  this information with Mrs. Lukacs and she seemed to be in favor  of proceeding with an implant more out of fear of overlooking the  risk of SCD. I offered, if she were to remain undetermined, a  two-week monitor looking for any evidence of ventricular  tachyarrhythmias. However, she preferred to further deliberate  the option of proceeding now or upon her return in the winter or  next summer with an implant, with her family. Her family  expressed concern about the availability of followup for a CRT-D  either in [place] or in [place], [place]. I offered to  help in  finding appropriate followup for her locally. Mrs. Croswell would  like to deliberate this further with her family including her 2  sons, one of whom is a cardiologist in [place] before  making a final determination.  .  TREATMENT  .  Non-ischemic cardiomyopathy  Continue Bisoprolol Fumarate Tablet, 5 MG, 2.5mg  only, Orally,  Once a day  Continue Entresto Tablet, 97-103 MG, 1 tablet, Orally, Twice a  day  Continue Lasix Tablet, 20 MG, 1 tablet, Orally, Once a day  .  FOLLOW UP  .  prn  .  Electronically signed by Guerry Minors , MD on  03/07/2018 at 07:22 PM EDT  .  Document electronically signed by Guerry Minors    .

## 2018-03-11 ENCOUNTER — Ambulatory Visit: Admitting: Internal Medicine

## 2018-03-16 ENCOUNTER — Ambulatory Visit

## 2018-03-17 ENCOUNTER — Ambulatory Visit: Admitting: Internal Medicine

## 2018-03-17 NOTE — Progress Notes (Signed)
* * *        **  Stephanie [name]**    --- ---    75 Y old Female, DOB: 11-27-1950    [address], [place], [place] [address]    Home: [phone]    Provider: Dequavious Harshberger, Dyanne Carrel, MD        * * *    Telephone Encounter    ---    Answered by   Webb Laws  Date: 03/17/2018         Time: 05:44 PM    Message                      Called Ms Roberts on [phone] with MRI and CPET results.         --- ---            Action Taken                      Webb Laws, MD 03/17/2018 5:44:58 PM >       [place]un[place] and Fuller [place] I just spoke with Ms Traber. We reviewed the LVEF 30%, no myocardial scarring, and only a very mildly impaired response to exercise on the CPET. We reviewed the pros and the cons of CRT-D and she understands that we wouldn't particularly expect the device to achieve any cardiovascular symptom improvement, due to her good current HF functional status, but that prevention of SCD and potential for longer-term stabilization of LVEF would be the indications for proceeding. She seems to have a good understanding of these considerations.       Fuller [place] - do let me know if there are any residual questions, or any new concerns that come up over the coming months. I told her that she can always give me a call if she has new thoughts or qus between now and November.      Best wishes,      Marchelle Folks                           * * *                ---          * * *          Patient: Stephanie [name] DOB: [birth year] Provider: Webb Laws, MD  03/17/2018    ---    Note generated by eClinicalWorks EMR/PM Software ([email or web address])

## 2018-04-25 ENCOUNTER — Ambulatory Visit: Admitting: Internal Medicine

## 2018-04-25 NOTE — Progress Notes (Signed)
* * *        **  Stephanie [name]**    --- ---    66 Y old Female, DOB: [birth year]    [address], [place], [place] [address]    Home: [phone]    Provider: Nishan Ovens, Dyanne Carrel, MD        * * *    Telephone Encounter    ---    Answered by   Boris Lown R  Date: 04/25/2018         Time: 09:37 PM    Message                      [employer] patient assistance form completed.         --- ---            Refills  Refill Entresto Tablet, 97-103 MG, Orally, 180, 1 tablet, Twice a  day, 90 days, Refills=4    --- ---          * * *                ---          * * *          PatientRoxan [name] DOB: 1951-07-17 Provider: Webb Laws, MD  04/25/2018    ---    Note generated by eClinicalWorks EMR/PM Software ([email or web address])

## 2018-06-03 ENCOUNTER — Ambulatory Visit

## 2018-07-07 LAB — UNMAPPED LAB RESULTS
Anion Gap (EXT): 9 mmol/L (ref 5–15)
BUN (EXT): 19 mg/dL (ref 9.0–23.0)
CO2 (EXT): 22 mmol/L (ref 20.0–31.0)
Chloride (EXT): 111 mmol/L — ABNORMAL HIGH (ref 98–107)
Potassium (EXT): 4.6 mmol/L (ref 3.40–5.10)
Sodium (EXT): 142 mmol/L (ref 136–145)

## 2018-07-08 ENCOUNTER — Ambulatory Visit: Admitting: Cardiovascular Disease

## 2018-07-11 ENCOUNTER — Ambulatory Visit

## 2018-07-15 ENCOUNTER — Ambulatory Visit: Admitting: Clinical Cardiac Electrophysiology

## 2018-07-15 ENCOUNTER — Ambulatory Visit

## 2018-07-30 ENCOUNTER — Ambulatory Visit: Admitting: Cardiovascular Disease

## 2018-07-30 ENCOUNTER — Ambulatory Visit: Admitting: Internal Medicine

## 2018-08-18 ENCOUNTER — Ambulatory Visit: Admitting: Cardiovascular Disease

## 2018-10-24 ENCOUNTER — Ambulatory Visit

## 2018-11-24 ENCOUNTER — Ambulatory Visit: Admitting: Cardiovascular Disease

## 2019-02-23 ENCOUNTER — Ambulatory Visit: Admitting: Cardiovascular Disease

## 2019-04-28 LAB — LIPID PROFILE (EXT)
Cholesterol (EXT): 179 mg/dL (ref 0.0–200)
HDL Cholesterol (EXT): 42 mg/dL (ref 40–60)
LDL Cholesterol, CALC (EXT): 117 mg/dL (ref 5.0–189.0)
NON HDL Cholesterol (EXT): 137 mg/dL (ref <190)
Triglycerides (EXT): 102 mg/dL (ref 0.0–150.0)

## 2019-05-27 ENCOUNTER — Ambulatory Visit: Admitting: Internal Medicine

## 2019-05-27 ENCOUNTER — Ambulatory Visit

## 2019-05-27 NOTE — Progress Notes (Signed)
 .  Progress Notes  .  Patient: Stephanie [name]  Provider: Webb Laws MD  .  DOB:[birth year] Age: 68 Y Sex: Female  Supervising Provider:: AMANDA VEST  Date: 05/27/2019  .  PCP: Unknown, Unknown    Date: 05/27/2019  .  --------------------------------------------------------------------------------  .  REASON FOR APPOINTMENT  .  1. HF follow-up  .  2. Echocardiogram today (first post-CRT implantation)  .  HISTORY OF PRESENT ILLNESS  .  GENERAL:   We had the pleasure to see Stephanie [name] today in our clinic at  [facility]. Stephanie [name] is a 36 yo with history of HTN,  HLD and Graves disease sp radioactive ablation who presents toady  for follow-up regarding her NICM, chronic systolic HF and LBBB.  She resides both in [place], where her primary cardiologist is  located, and the [place] where her sons live. Stephanie [name]'s symptoms  started in the early 46s. At that time she developed SOB on  exertion. She was able to walk a block and climb 20 steps before  developing SOB. She denied orthopnea and PND. She also complained  of 'chest burning' that did not correlate with exertion. Given  the SOB and the chest burning, in 2008 she had LHC that did not  show evidence of coronary artery disease. She also underwent  echocardiogram that showed mild to moderate mitral valve  regurgitation and LVEDD at 5.6 cm. She was started on atenolol  and enalapril and she continued following with her cardiologist  periodically. In 2010 she was diagnosed with Graves disease and  she received radioactive iodine ablation. Her symptoms had been  stable up to two years ago when she started having worsening SOB  on exertion. She ended up having SOB on minimal exeartion but she  never developed PND, orthopnea or LE edema. She underwent work up  in [place], including a TEE that was read as severe mitral  regurgitation, for which she was referred to a Careers adviser for [facility].  However on surgical review, the MR was recognized to be less  severe and  functional in nature. Her LV was more dilated compared  to 2008 (6.1 cm) and her EF had dropped to 40%. She was started  on bisoprolol, Entresto and furosemide and a surgery was not  pursed. She was successfully uptitrated to max dose 97/103mg  bid  Entresto. She has followed annually with the HF clinic with LVEF  35% in 2018 and 2019. Given the persistent LVEF 35% with NYHA  class I-II symptoms and LBBB she was referred to Dr Malcolm Metro and  ultimately underwent CRT-D implantation on 07/08/2018. Stephanie [name]  was seen in clinic today, 05/27/2019, and has been feeling very  well since CRT implantation. She has had no interval  hospitalizations or medical changes. She has been mostly in the  [place], and staying with the family locally during the pandemic, has  avoided any COVID-19 exposures and denies any symptoms of  infection. She can walk unlimited on the flat and has no symptoms  when climbing a flight of stairs. She specifically denies any  dyspnea on exertion, orthopnea, LE edema, chest pain,  palpitations or dizziness. She is still tolerating the max  Entresto dose very well. She has mild orthostatic lightheadness  upon first standing. BPs at home 120s/70s. There has been no ICD  discharges and no complications at the CRT-D site. She underwent  a repeat echo and device interrogation today. She completed labs  with her PCP 04/28/19  as below.  .  Heart Failure:  NYHA Class  .  -I  .  Marland Kitchen  CURRENT MEDICATIONS  .  Taking Aspirin 81 MG Tablet Chewable 1 tablet Orally Once a day  Taking Bisoprolol Fumarate 5 MG Tablet 2.5mg  only Orally Once a  day  Taking Entresto 97-103 MG Tablet 1 tablet Orally Twice a day  Taking Lasix 20 MG Tablet 1 tablet Orally Once a day  Taking Levothyroxine Sodium 50 mcg Tablet 6 days and  1 day Orally Once a day  Taking Lipitor 10 MG Tablet 1 tablet Orally Once a day  Taking Omeprazole 10 MG Capsule Delayed Release 1 capsule Orally  Once a day  Not-Taking/PRN Celebrex - Capsule Arcoxia  (non-FDA approved) 90mg   Orally Once a day  Medication List reviewed and reconciled with the patient  .  PAST MEDICAL HISTORY  .  Mild mitral regurgitation  Hypertension  Hyperlipidemia  Osteoarthritis  Graves disease sp radioactive ablation 2010  Nonischemic cardiomyopathy, chronic systolic HF  LBBB, now s/p CRT-D (06/2018)  Gastritis  .  ALLERGIES  .  None  .  SURGICAL HISTORY  .  lumpectomy  .  FAMILY HISTORY  .  2 son(s) , 1 daughter(s) - healthy.  Mother with CAD. Father deceased without history of heart  disease.  .  SOCIAL HISTORY  .  .  Tobaccohistory:Never smoked.  .  Marital Status Married.  .  Caffeine: 3 cups daily.  .  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  .  Alcohol Denies.  .  Patient denies tobacco, ETOH, or IVDUTwo sons are doctors Home  country is [place] Pt cell [phone].  Marland Kitchen  HOSPITALIZATION/MAJOR DIAGNOSTIC PROCEDURE  .  No hospitalization for heart failure  .  REVIEW OF SYSTEMS  .  CardioVascular:  .  Neurologic    no numbness or weakness, no visual disturbance, no  difficulty speaking, parasthesias . General/Constitutional    no  fever, chills, night sweats, sleep disturbance, headaches, no  change in appetite, no fatigue, no weight gain/loss .  Gastrointestinal    no melena, no hematochezia, hematemses, no  blood in stool, nausea, vomiting, diarrhea, abdominal pain .  Hematology    no easy bruising, no prolonged bleeding .  Peripheral Vascular    no claudication, no skin ulceration .  Endocrine    no polydipsia, polyphagia, hot and cold intolerance  . Skin    no rash . Cardiovascular    no chest pain at rest or  with exertion, no dyspnea at rest or with exertion, no  palpitations, no fluid accumulation in the legs/edema, no  increase in abnormal girth, no presyncope, no syncope, no  orthopnea, no paroxysmal nocturnal dyspnea . Respiratory    no  cough, wheezing, shortness of breath at rest, sore throat, upper  airway congestion, or hemoptysis . Musculoskeletal     no  arthraglias, myalgias, or muscle aches .  Marland Kitchen  VITAL SIGNS  .  Pain scale 0, Ht-in 62, Wt-lbs 150.2, BMI 27.47, BP 130/76, HR  67, RR 16, BSA 1.72, O2 99, Ht-cm 167.64, Wt-kg 68.13, Wt Change  3.2 lb.  Marland Kitchen  EXAMINATION  .  Cardiac Testing:  EKG: Sinus rhythm. LBBB .  Echo:  .  05/27/2019: The left ventricle is normal in size. There is normal  left ventricular wall thickness. Left ventricular systolic  function ismildly reduced. Ejection Fraction = 45-50%. There is  mild global hypokinesis of the left ventricle.The right ventricle  is normal  size. There is a device lead in the right ventricle.  The right ventricular systolic functionis normal. TAPSE is 2.4cm.  TDI S' is 10.3cm/s.The left atrium is mildly dilated. Right  atrial size is normal. The inferior vena cava is normal size with  > 50%respirophasic variation, consistent with normal right atrial  pressure. There is mild mitral annular calcification. The mitral  valve is normal in structure. There is mild mitral  regurgitation.The tricuspid valve is normal in structure. There  is mild tricuspid regurgitation. Right ventricular systolic  pressure isnormal.The aortic valve is trileaflet. The aortic  valve is mildly thickened. The aortic valve opens well. Mild  aorticregurgitation.The pulmonic valve is not well seen, but is  grossly normal.The aortic root is normal size. The ascending  aorta is normal size.There is no pericardial effusion.Compared to  the prior study from 02/12/18, the LV systolic function is  improved.  .  01/2018: No change compare to prior, LVEF 35%  .  01/30/17: The left ventricle is mildly dilated. There is no  thrombus. There is normal left ventricular wall thickness. Left  ventricular systolic function is moderately reduced. Ejection  Fraction = 35%. The interventricular septum is asynchronous.  Basal-mid septal akinesis. The right ventricle is normal in size  and function. The left atrial size is normal. Right atrial size  is normal. A dilated  inferior vena cava suggests increased right  atrial pressure. The mitral valve leaflets appear mildly  thickened,. There is moderate mitral annular calcification. There  is mild mitral regurgitation. The tricuspid valve is normal.  There is mild tricuspid regurgitation. Right ventricular systolic  pressure is normal. The aortic valve is trileaflet. The aortic  valve opens well. Mild aortic regurgitation. The pulmonic valve  is not well seen, but is grossly normal. Trace pulmonic valvular  regurgitation. The aortic root is normal size. The ascending  aorta is normal size.There is no pericardial effusion.  .  03/14/16: The left ventricle is mildly dilated. There is no  thrombus. There is normal left ventricular wall thickness. Left  ventricular systolic function is moderately reduced.Ejection  Fraction = 35-40%. The interventricular septum is asynchronous.  Basal-mid septal akinesis. Mild mitral and aortic valve  regurgitation.  .  .  ICD Interrogation: 05/27/19 CRT-D interrogation: Normal dual CRT  ICD function, no new atrial arrhythmias, no new ventricular  arrhythmia detections . 27 % atrial pacing, 97 % biventricular  pacing, good heart rate distribution. Battery longevity is 7  years, home monitoring will continue..  Cardiac Catheterization: LHC (2008): without evidence of CAD per  patient report .  LabsJune 10 2019 local labs: Cr 0.8, K 4.8, normal ALT and ALK,  TSH 0.8, Hgb 14.3  A1c 6.0%; TSH 2.36, normal renal function and chem7, normal CBC,  normal CK  Sept 8 2020 local labs: glu 123, BUN 27, Cr 0.64, Na 142, K 4.7,  bicarb 26, Ca 9.1, LFTs normal, WBC 7.5, Hgb 14.4, plts 218,  Tchol 179, TG 102, HDl 42, LDL 117, TSH 3.2.  .  PHYSICAL EXAMINATION  .  General Examination:  General Appearance  well developed, well nourished, alert,  oriented, in no acute distress. Head  normocephalic. Eyes  sclera  non-icteric. Oral Cavity  mucosa moist. Neck/Thyroid  carotid  pulse normal, no carotid bruit, no jugular venous  distension.  Skin  no rashes. Heart  S1, S2 regular, 1/6 PSM, no added sounds  . Lungs  clear to auscultation bilaterally with no wheezing, no  crackles, no rhonchi. Chest  no costochondral tenderness. Abdomen   soft, non tender, non distended, bowel sounds present, no  abdominal or flank bruits, no organomegaly, no prominent  pulsation, no bruits. Extremities  no edema, no clubbing, no  cyanosis. Peripheral Pulses  2 + posterior tibial, 2 + radial, 2  + dorsalis pedis, normal symmetric pulses bilaterally. HEENT   normocephalic, oral mucosa moist.  .  ASSESSMENTS  .  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  Mild mitral regurgitation - I34.0  .  LBBB (left bundle branch block) - I44.7  .  In summary, Stephanie Bechtold is a 65 yo with history of chronic  systolic HF, NICM, LBBB and mild MR who is now s/p CRT-D in  06/2018. She is asymptomatic and her echocardiogram shows partial  LVEF recovery s/p CRT from EF 35% to 45-50%. She has had no  recent hospitalizations and is tolerating high doses of GDMT. 1.  Chronic systolic HF, partial LVEF recovery post-CRT: Stephanie [name] is  feeling well, is euvolemic and exam today and is tolerating  excellent HFrEF GDMT with 97-103mg  Entresto bid and bisoprolol  5mg . Although she has not pushed herself physically during the  pandemic, she cannot identify any physical limitations and is  therefore now NYHA class I. Heart rate and blood pressure are  both at goal. We made no HF medication changes today and will  continue her current excellent doses of bisoprolol and Entresto.  She takes low-dose furosemide. She is not on spironolatone, which  is an option that we could consider for the future if she were to  develop new symptoms, although we would have to be confident that  her serum potassium could be closely monitored. We reviewed HF  symptoms to be vigilent for, the importance of COVID-19  avoidance, and healthy exercise and nutrition plans. The aspirin  81mg  is optional; she does not  have DM and there is no data  supporting its use in NICM or for primary prevention fo CVD among  women. Thank you for involving [place] in the care of Stephanie [name]. We  will see her back in clinic in approximately 12 months, and are  available for sooner consultation if there are any new symptoms  or concerns.  .  FOLLOW UP  .  12 Months  .  Electronically signed by Boris Lown MD on  06/22/2019 at 05:19 PM EST  .  CONFIRMATORY SIGN OFF  .  Marland Kitchen  Document electronically signed by VEST, Dyanne Carrel MD  .

## 2019-05-27 NOTE — Progress Notes (Signed)
 * * Stephanie, [name] **DOB:** 04-Jan-1951 (68 yo F) **Acc No.** [id number] **DOS:**  05/27/2019    ---       Stephanie [name]**    ------    10 Y old Female, DOB: 27-Aug-1950, External MRN: [id number]    Account Number: [id number]    [address], [place], [id number]    Home: [phone]    Insurance: MEDICARE    PCP: Unknown Unknown Referring: Guerry Minors    Appointment Facility: [facility]        * * *    05/27/2019 Progress Notes: Stephanie [name] **CHN#:** [id number]    ------    ---       **Reason for Appointment**    ---      1\. HF follow-up    ---    2\. Echocardiogram today (first post-CRT implantation)    ---     **History of Present Illness**    ---     _GENERAL_ :    We had the pleasure to see [name] in our clinic at [facility] [name] is a 68 yo with history of HTN, HLD and Graves disease sp  radioactive ablation who presents toady for follow-up regarding her NICM,  chronic systolic HF and LBBB. She resides both in [place], where her primary  cardiologist is located, and the [place] where her sons live.    [name]'s symptoms started in the early 44s. At that time she developed SOB  on exertion. She was able to walk a block and climb 20 steps before developing  SOB. She denied orthopnea and PND. She also complained of 'chest burning' that  did not correlate with exertion. Given the SOB and the chest burning, in 2008  she had LHC that did not show evidence of coronary artery disease. She also  underwent echocardiogram that showed mild to moderate mitral valve  regurgitation and LVEDD at 5.6 cm. She was started on atenolol and enalapril  and she continued following with her cardiologist periodically. In 2010 she  was diagnosed with Graves disease and she received radioactive iodine  ablation.    Her symptoms had been stable up to two years ago when she started having  worsening SOB on exertion. She ended up having SOB on minimal exeartion but  she never developed PND,  orthopnea or LE edema. She underwent work up in  [place], including a TEE that was read as severe mitral regurgitation, for  which she was referred to a Careers adviser for [facility]. However on surgical review, the  MR was recognized to be less severe and functional in nature. Her LV was more  dilated compared to 2008 (6.1 cm) and her EF had dropped to 40%. She was  started on bisoprolol, Entresto and furosemide and a surgery was not pursed.  She was successfully uptitrated to max dose 97/103mg  bid Entresto. She has  followed annually with the HF clinic with LVEF 35% in 2018 and 2019. Given the  persistent LVEF 35% with NYHA class I-II symptoms and LBBB she was referred to  Dr Stephanie [name] and ultimately underwent CRT-D implantation on 07/08/2018.    [name] was seen in clinic today, 05/27/2019, and has been feeling very well  since CRT implantation. She has had no interval hospitalizations or medical  changes. She has been mostly in the [place], and staying with the family locally  during the pandemic, has avoided any COVID-19 exposures and denies any  symptoms of infection. She can walk unlimited on the flat and has no symptoms  when climbing a flight of stairs. She specifically denies any dyspnea on  exertion, orthopnea, LE edema, chest pain, palpitations or dizziness. She is  still tolerating the max Entresto dose very well. She has mild orthostatic  lightheadness upon first standing. BPs at home 120s/70s. There has been no ICD  discharges and no complications at the CRT-D site. She underwent a repeat echo  and device interrogation today. She completed labs with her PCP 04/28/19 as  below.     _Heart Failure_ :    NYHA Class \- I.     **Current Medications**    ---    Taking    * Aspirin 81 MG Tablet Chewable 1 tablet Orally Once a day    ---    * Bisoprolol Fumarate 5 MG Tablet 2.5mg  only Orally Once a day    ---    * Entresto 97-103 MG Tablet 1 tablet Orally Twice a day    ---    * Lasix 20 MG Tablet 1 tablet Orally Once a day     ---    * Levothyroxine Sodium 50 mcg Tablet 6 days and 1 day Orally Once a day    ---    * Lipitor 10 MG Tablet 1 tablet Orally Once a day    ---    * Omeprazole 10 MG Capsule Delayed Release 1 capsule Orally Once a day    ---    Not-Taking/PRN    * Celebrex - Capsule Arcoxia (non-FDA approved) 90mg  Orally Once a day    ---    * Medication List reviewed and reconciled with the patient    ---      **Past Medical History**    ---      Mild mitral regurgitation.        ---    Hypertension.        ---    Hyperlipidemia.        ---    Osteoarthritis.        ---    Graves disease sp radioactive ablation 2010.        ---    Nonischemic cardiomyopathy, chronic systolic HF .        ---    LBBB, now s/p CRT-D (06/2018).        ---    Gastritis .        ---      **Surgical History**    ---      lumpectomy    ---     **Family History**    ---      2 son(s) , 1 daughter(s) - healthy.    ---    Mother with CAD. Father deceased without history of heart disease.    ---      **Social History**    ---    Tobacco  history: Never smoked.    Marital Status  Married.    Caffeine: 3 cups daily.    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    Alcohol  Denies.  Patient denies tobacco, ETOH, or IVDU    Two sons are doctors    Home country is [place]    Pt cell [phone].    ---      **Allergies**    ---      None    ---      **Hospitalization/Major Diagnostic Procedure**    ---  No hospitalization for heart failure    ---     **Review of Systems**    ---     _CardioVascular_ :    Neurologic no numbness or weakness, no visual disturbance, no difficulty  speaking, parasthesias. General/Constitutional no fever, chills, night sweats,  sleep disturbance, headaches, no change in appetite, no fatigue, no weight  gain/loss. Gastrointestinal no melena, no hematochezia, hematemses, no blood  in stool, nausea, vomiting, diarrhea, abdominal pain. Hematology no easy  bruising, no prolonged bleeding.  Peripheral Vascular no claudication, no skin  ulceration. Endocrine no polydipsia, polyphagia, hot and cold intolerance.  Skin no rash. Cardiovascular no chest pain at rest or with exertion, no  dyspnea at rest or with exertion, no palpitations, no fluid accumulation in  the legs/edema, no increase in abnormal girth, no presyncope, no syncope, no  orthopnea, no paroxysmal nocturnal dyspnea. Respiratory no cough, wheezing,  shortness of breath at rest, sore throat, upper airway congestion, or  hemoptysis. Musculoskeletal no arthraglias, myalgias, or muscle aches.         **Vital Signs**    ---    Pain scale 0, Ht-in 62, Wt-lbs 150.2, BMI 27.47, BP 130/76, HR 67, RR 16, BSA  1.72, O2 99, Ht-cm 167.64, Wt-kg 68.13, Wt Change 3.2 lb.      **Examination**    ---     _Cardiac Testing:_    EKG:  Sinus rhythm. LBBB .    Echo:    05/27/2019: The left ventricle is normal in size. There is normal left  ventricular wall thickness. Left ventricular systolic function ismildly  reduced. Ejection Fraction = 45-50%. There is mild global hypokinesis of the  left ventricle.The right ventricle is normal size. There is a device lead in  the right ventricle. The right ventricular systolic functionis normal. TAPSE  is 2.4cm. TDI S' is 10.3cm/s.The left atrium is mildly dilated. Right atrial  size is normal. The inferior vena cava is normal size with > 50%respirophasic  variation, consistent with normal right atrial pressure. There is mild mitral  annular calcification. The mitral valve is normal in structure. There is mild  mitral regurgitation.The tricuspid valve is normal in structure. There is mild  tricuspid regurgitation. Right ventricular systolic pressure isnormal.The  aortic valve is trileaflet. The aortic valve is mildly thickened. The aortic  valve opens well. Mild aorticregurgitation.The pulmonic valve is not well  seen, but is grossly normal.The aortic root is normal size. The ascending  aorta is normal size.There is no  pericardial effusion.Compared to the prior  study from 02/12/18, the LV systolic function is improved.        01/2018: No change compare to prior, LVEF 35%        01/30/17: The left ventricle is mildly dilated. There is no thrombus. There is  normal left ventricular wall thickness. Left ventricular systolic function is  moderately reduced. Ejection Fraction = 35%. The interventricular septum is  asynchronous. Basal-mid septal akinesis. The right ventricle is normal in size  and function. The left atrial size is normal. Right atrial size is normal. A  dilated inferior vena cava suggests increased right atrial pressure. The  mitral valve leaflets appear mildly thickened,. There is moderate mitral  annular calcification. There is mild mitral regurgitation. The tricuspid valve  is normal. There is mild tricuspid regurgitation. Right ventricular systolic  pressure is normal. The aortic valve is trileaflet. The aortic valve opens  well. Mild aortic regurgitation. The pulmonic valve is not well seen, but is  grossly  normal. Trace pulmonic valvular regurgitation. The aortic root is  normal size. The ascending aorta is normal size.There is no pericardial  effusion.        03/14/16: The left ventricle is mildly dilated. There is no thrombus. There is  normal left ventricular wall thickness. Left ventricular systolic function is  moderately reduced.Ejection Fraction = 35-40%. The interventricular septum is  asynchronous. Basal-mid septal akinesis. Mild mitral and aortic valve  regurgitation.    .    ICD Interrogation:  05/27/19 CRT-D interrogation: Normal dual CRT ICD function,  no new atrial arrhythmias, no new ventricular arrhythmia detections . 27 %  atrial pacing, 97 % biventricular pacing, good heart rate distribution.  Battery longevity is 7 years, home monitoring will continue..    Cardiac Catheterization:  LHC (2008): without evidence of CAD per patient  report .    Labs January 27 2018 local labs: Cr 0.8, K 4.8, normal ALT  and ALK, TSH 0.8, Hgb  14.3    A1c 6.0%; TSH 2.36, normal renal function and chem7, normal CBC, normal CK    Sept 8 2020 local labs: glu 123, BUN 27, Cr 0.64, Na 142, K 4.7, bicarb 26, Ca  9.1, LFTs normal, WBC 7.5, Hgb 14.4, plts 218, Tchol 179, TG 102, HDl 42, LDL  117, TSH 3.2.         **Physical Examination**    ---     _General Examination_ :    General Appearance well developed, well nourished, alert, oriented, in no  acute distress. Head normocephalic. Eyes sclera non-icteric. Oral Cavity  mucosa moist. Neck/Thyroid carotid pulse normal, no carotid bruit, no jugular  venous distension. Skin no rashes. Heart S1, S2 regular, 1/6 PSM, no added  sounds . Lungs clear to auscultation bilaterally with no wheezing, no  crackles, no rhonchi. Chest no costochondral tenderness. Abdomen soft, non  tender, non distended, bowel sounds present, no abdominal or flank bruits, no  organomegaly, no prominent pulsation, no bruits. Extremities no edema, no  clubbing, no cyanosis. Peripheral Pulses 2 + posterior tibial, 2 + radial, 2 +  dorsalis pedis, normal symmetric pulses bilaterally. HEENT normocephalic, oral  mucosa moist.         **Assessments**    ---    1\. 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 Mild mitral regurgitation - I34.0    ---    3\. LBBB (left bundle branch block) - I44.7    ---     In summary, [name] is a 31 yo with history of chronic systolic HF, NICM,  LBBB and mild MR who is now s/p CRT-D in 06/2018. She is asymptomatic and her  echocardiogram shows partial LVEF recovery s/p CRT from EF 35% to 45-50%. She  has had no recent hospitalizations and is tolerating high doses of GDMT.    1\. Chronic systolic HF, partial LVEF recovery post-CRT: Ms Stephanie [name] is feeling  well, is euvolemic and exam today and is tolerating excellent HFrEF GDMT with  97-103mg  Entresto bid and bisoprolol 5mg . Although she has not pushed herself  physically during the pandemic, she cannot identify any physical  limitations  and is therefore now NYHA class I. Heart rate and blood pressure are both at  goal. We made no HF medication changes today and will continue her current  excellent doses of bisoprolol and Entresto. She takes low-dose furosemide. She  is not on spironolatone, which is an option that we could consider for the  future if she were to develop new symptoms, although we would have to be  confident that her serum potassium could be closely monitored. We reviewed HF  symptoms to be vigilent for, the importance of COVID-19 avoidance, and healthy  exercise and nutrition plans.    The aspirin 81mg  is optional; she does not have DM and there is no data  supporting its use in NICM or for primary prevention fo CVD among women.    Thank you for involving [place] in the care of Ms Stephanie [name]. We will see her back in  clinic in approximately 12 months, and are available for sooner consultation  if there are any new symptoms or concerns.    ---      **Follow Up**    ---    12 Months    Electronically signed by Boris Lown MD on 06/22/2019 at 05:19 PM EST    Sign off status: Completed        * * *        Cardiac Subspecialty    [address]    [place], [address]    [place], [place] [address]    Tel: [phone]    Fax: [phone]              * * *          Progress Note: Stephanie [name] 05/27/2019    ---    Note generated by eClinicalWorks EMR/PM Software ([email or web address])

## 2019-06-23 ENCOUNTER — Ambulatory Visit

## 2019-10-02 LAB — HEMOGLOBIN A1C
Estimated Average Glucose mg/dL (INT/EXT): 128 mg/dL — ABNORMAL HIGH (ref 68–123)
HEMOGLOBIN A1C % (INT/EXT): 6.1 % — ABNORMAL HIGH (ref <=5.7)

## 2019-10-14 ENCOUNTER — Ambulatory Visit: Admitting: Internal Medicine

## 2019-10-14 NOTE — Progress Notes (Signed)
* * *      CHRISHELLE, ZITO **DOB:** 1951-01-16 (69 yo F) **Acc No.** [id number] **DOS:**  10/14/2019    ---       Durwin Nora**    ------    7 Y old Female, DOB: 05/27/1951    [address], [place], [place] [id number]    Home: [phone]    Provider: Elona Yinger, Dyanne Carrel        * * *    Telephone Encounter    ---    Answered by  Boris Lown R Date: 10/14/2019       Time: 04:39 PM    Reason  refill    ------            Refills Refill Entresto Tablet, 97-103 MG, Orally, 180, 1 tablet, Twice a  day, 90 days, Refills=4    ------          * * *                ---          * * *         Provider: Shayne Deerman R 10/14/2019    ---    Note generated by eClinicalWorks EMR/PM Software ([email or web address])

## 2019-10-19 ENCOUNTER — Ambulatory Visit: Admitting: Cardiovascular Disease

## 2019-10-29 ENCOUNTER — Ambulatory Visit

## 2020-01-18 ENCOUNTER — Ambulatory Visit: Admitting: Cardiovascular Disease

## 2020-04-18 ENCOUNTER — Ambulatory Visit: Admitting: Cardiovascular Disease

## 2020-07-18 ENCOUNTER — Ambulatory Visit: Admitting: Cardiovascular Disease

## 2020-07-21 LAB — HEMOGLOBIN A1C
Estimated Average Glucose mg/dL (INT/EXT): 117 mg/dL (ref 68–123)
HEMOGLOBIN A1C % (INT/EXT): 5.7 % (ref <=5.7)

## 2020-08-10 ENCOUNTER — Ambulatory Visit: Admitting: Internal Medicine

## 2020-08-10 NOTE — Progress Notes (Signed)
* * Stephanie [name] S **DOB:** 10/07/1950 (69 yo F) **Acc No.** [id number] **DOS:**  08/10/2020    ---        Stephanie [name]**    ------    69 Y old Female, DOB: 20-Oct-1950, External MRN: [id number]    Account Number: [id number]    [address], [place] [facility], [place]    Home: [phone]    Insurance: MEDICARE    Referring: Guerry Minors    Appointment Facility: Cardiac Subspecial[facility]        * * *    08/10/2020    Progress Notes: Stephanie [name] **CHN#:** [id number]    ------    ---        **Reason for Appointment**    ---      1\. HF 1 YEAR F/U ECHO 9AM    ---      **History of Present Illness**    ---    _GENERAL_ :    We had the pleasure to see Stephanie [name] today in our clinic at [facility]. Stephanie [name] is a 69 yo woman with history of HTN, HLD and Graves  disease sp radioactive ablation who presents toady for follow-up regarding her  NICM, chronic systolic HF and LBBB. She is currently residing in the [place] where  her sons live.    Stephanie [name]'s symptoms started in the early 70s. At that time she developed SOB  on exertion. She was able to walk a block and climb 20 steps before developing  SOB. She denied orthopnea and PND. She also complained of 'chest burning' that  did not correlate with exertion. Given the SOB and the chest burning, in 2008  she had LHC that did not show evidence of coronary artery disease. She also  underwent echocardiogram that showed mild to moderate mitral valve  regurgitation and LVEDD at 5.6 cm. She was started on atenolol and enalapril  and she continued following with her cardiologist periodically. In 2010 she  was diagnosed with Graves disease and she received radioactive iodine  ablation.    Her symptoms had been stable up to two years ago when she started having  worsening SOB on exertion. She ended up having SOB on minimal exeartion but  she never developed PND, orthopnea or LE edema. She underwent work up in  [place], including a TEE that was read as severe mitral  regurgitation, for  which she was referred to a Careers adviser for [facility]. However on surgical review, the  MR was recognized to be less severe and functional in nature. Her LV was more  dilated compared to 2008 (6.1 cm) and her EF had dropped to 40%. She was  started on bisoprolol, Entresto and furosemide and a surgery was not pursed.  She was successfully uptitrated to max dose 97/103mg  bid Entresto. She has  followed annually with the HF clinic with LVEF 35% in 2018 and 2019. Given the  persistent LVEF 35% with NYHA class I-II symptoms and LBBB she was referred to  Dr Malcolm Metro and ultimately underwent CRT-D implantation on 07/08/2018. Her LVEF  improved therafter to 45-50% on echo. She was last seen in clinic a year ago  on 05/27/2019.    Today, 08/10/20, Stephanie [name] returns to clinic for routine follow-up and an  echocardiogram. She has had no interval hospitalizations or medical changes.  She has been mostly in the [place], and staying with the family locally during the  pandemic, has avoided any COVID-19 sympoms.  She can walk unlimited on the flat  and has no symptoms when climbing a flight of stairs. She has been watching  her diet and trying to exercise more, due to a prior hemoglobin A1c in the  prediabetes range, and is pleased to have lost about 12 pounds. She has not  been on an SGLT2 inhibitor in the past. She specifically denies any dyspnea on  exertion, orthopnea, LE edema, chest pain, palpitations or dizziness. She is  still tolerating the max Entresto dose very well without significant dizziness  or lightheadness. There has been no ICD discharges and no complications at the  CRT-D site. Has received three doses of Covid vaccination.    _Heart Failure_ :    NYHA Class \- I.      **Current Medications**    ---    Taking      * Aspirin 81 MG Tablet Chewable 1 tablet Orally Once a day     ---    * Bisoprolol Fumarate 5 MG Tablet 1/2 tablet Orally Once a day     ---    * Caltrate 600+D3 600-800 MG-UNIT Tablet 1 tablet  with a meal Orally Once a day     ---    * Entresto 97-103 MG Tablet 1 tablet Orally Twice a day     ---    * Lasix 20 MG Tablet 1 tablet Orally Once a day     ---    * Levothyroxine Sodium 100 MCG Tablet 1 tablet in the morning on an empty stomach Orally Once a day, Notes: 100mg  5 days, 50 mg 1 day     ---    * Lipitor 10 MG Tablet 1 tablet Orally Once a day     ---    * Omeprazole 10 MG Capsule Delayed Release 1 capsule Orally Once a day     ---    * Turmeric 500 MG Capsule 1 capsule Orally Once a day     ---    * Vitamin D 50 MCG (2000 UT) Tablet 1 tablet Orally Once a day     ---    Medication List reviewed and reconciled with the patient    ---      **Past Medical History**    ---      Mild mitral regurgitation.        ---    Hypertension.        ---    Hyperlipidemia.        ---    Osteoarthritis.        ---    Graves disease sp radioactive ablation 2010.        ---    Nonischemic cardiomyopathy, chronic HFrEF.        ---    LBBB, now s/p CRT-D (06/2018).        ---    Gastritis .        ---      **Surgical History**    ---      lumpectomy    ---      **Family History**    ---      2 son(s) , 1 daughter(s) - healthy.    ---    Mother with CAD. Father deceased without history of heart disease.    ---      **Social History**    ---    Tobacco  history: Never smoked.    Marital Status  Married.    Caffeine: 3  cups daily.    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    Alcohol  Denies.    Patient denies tobacco, ETOH, or IVDU    Two sons are doctors    Splits time between [place] and [place]    Pt cell [phone].    ---      **Allergies**    ---      None    ---      **Hospitalization/Major Diagnostic Procedure**    ---      No hospitalization for heart failure    ---      **Review of Systems**    ---    A 12-point review of systems was completed and negative unless otherwise  reported in the HPI.      **Vital Signs**    ---    Pain scale **0** , Ht-in 62, Wt-lbs **137.4** , BMI  **25.13**  , BP **117/78**  , HR **68** , RR **16** , BSA **1.65** , O2 **97%** , Ht-cm 167.64, Wt-kg  **62.32** , Wt Change -12.8 lb.      **Examination**    ---    _Cardiac Testing:_    EKG:  08/10/20: A-sensed sinus rhythm, BiV paced    2019: Sinus rhythm,LBBB  .    Echo:    08/10/2020:  The left ventricle is normal in size. There is normal left  ventricular wall thickness. Left ventricular systolic function is mildly  reduced. Ejection Fraction =  45-50%  .  Global longitudinal strain is - 15%  . The tissue Doppler diastolic parameters are indeterminate. There is mild  global hypokinesis of the left ventricle. The interventricular septum is  asynchronous.  The right ventricle is normal in size and function  . There is  a device lead in the right ventricle. TAPSE is 1.8cm. The left atrium is  mildly dilated. Right atrial size is normal. The inferior vena cava is normal  size with > 50% respirophasic variation, consistent with normal right atrial  pressure. There is no Color Doppler evidence for an atrial septal defect.  There is mild mitral annular calcification. The mitral valve leaflets appear  mildly thickened,. There is no mitral valve  stenosis. There is mild mitral  regurgitation. The tricuspid valve is normal in structure. There is mild  tricuspid regurgitation. Right ventricular systolic pressure is normal. The  aortic valve is mildly thickened. The aortic valve is trileaflet. The aortic  valve opens well. No hemodynamically significant valvular aortic stenosis.  Mild aortic regurgitation. The pulmonic valve is not well seen, but is grossly  normal. Trace pulmonic valvular regurgitation. The aortic root is normal size.  The ascending aorta is normal size. There is no pericardial effusion.  Prominent anterior fat pad.        05/27/2019:  The left ventricle  is normal in size. There is normal left  ventricular wall thickness. Left ventricular systolic function is  mildly  reduced. Ejection Fraction = 45-50%. There is  mild global hypokinesis of the  left ventricle.  The right ventricle is normal size. There is a device lead in  the right ventricle. The right ventricular systolic function  is normal. Mild  MR.        6  /2019: No change compare to prior, LVEF 35%        01/30/17: The left ventricle is mildly dilated. There is no thrombus. There is  normal left ventricular wall thickness. Left ventricular systolic function is  moderately reduced. Ejection Fraction =  35%. The interventricular septum is  asynchronous. Basal-mid septal akinesis. The right ventricle is normal in size  and function. The left atrial size is normal. Right atrial size is normal. A  dilated inferior vena cava suggests increased right atrial pressure. The  mitral valve leaflets appear mildly thickened,. There is moderate mitral  annular calcification. There is mild mitral regurgitation. The tricuspid valve  is normal. There is mild tricuspid regurgitation. Right ventricular systolic  pressure is normal. The aortic valve is trileaflet. The aortic valve opens  well. Mild aortic regurgitation. The pulmonic valve is not well seen, but is  grossly normal. Trace pulmonic valvular regurgitation. The aortic root is  normal size. The ascending aorta is normal size.  There is no pericardial  effusion.        03/14/16: The left ventricle is mildly dilated. There is no thrombus  . There  is normal left ventricular wall thickness. Left ventricular systolic function  is moderately reduced.Ejection Fraction = 35-40%. The interventricular septum  is asynchronous. Basal-mid septal akinesis. Mild mitral and aortic valve  regurgitation.    .    ICD Interrogation:  08/10/20 CRT-D interrogation: Normal DDD CRT-D function,  no changes made to settings. Histogram shows 0.3% atrial pacing, 98.3% bi-  ventricular pacing, good heart rate distribution, no new atrial or ventricular  arrhythmias since last visit in October 2020. LV capture threshold 2.75V at  0.61ms. Home remote monitoring  will continue. The patient will return to clinic  in 1 year.        05/27/19 CRT-D interrogation: Normal dual CRT ICD function, no new atrial  arrhythmias, no new ventricular arrhythmia  detections . 27 % atrial pacing,  97 % biventricular pacing, good heart rate distribution. Battery longevity is  7 years, home monitoring will continue.  .    Cardiac Catheterization:  LHC (2008): without evidence of CAD per patient  report  .    Labs  January 27 2018 local labs: Cr 0.8, K 4.8, normal ALT and ALK, TSH 0.8, Hgb  14.3    A1c 6.0%; TSH 2.36, normal renal function and chem7, normal CBC, normal CK    Sept 8 2020 local labs: glu 123, BUN 27, Cr 0.64, Na 142, K 4.7, bicarb 26, Ca  9.1, LFTs normal, WBC 7.5, Hgb 14.4, plts 218, Tchol 179, TG 102, HDl 42, LDL  117, TSH 3.21 Jul 2020: Cr 0.69, glu 106, K 4.1, Na 143, LDL 100, TSH 2.4, A1c 5.7%, Hgb  14.3    .          **Physical Examination**    ---    _General Examination_ :    General Appearance  well developed  ,  well nourished  ,  alert  ,  oriented  ,  in no acute distress  . Head  normocephalic  . Eyes  sclera non-icteric  .  Oral Cavity  mucosa moist  . Neck/Thyroid  carotid pulse normal  ,  no carotid  bruit  ,  no jugular venous distension  . Skin  no rashes  . Heart  S1, S2  regular, 1/6 PSM, no added sounds  . Lungs  clear to auscultation bilaterally  with no wheezing, no crackles, no rhonchi  . Chest  no costochondral  tenderness  . Abdomen  soft  ,  non tender  ,  non distended  ,  bowel sounds  present  ,  no abdominal or flank bruits  ,  no organomegaly  ,  no prominent  pulsation  ,  no bruits  . Extremities  no edema, no clubbing, no cyanosis  .  Peripheral Pulses  2 + posterior tibial  ,  2 + radial  ,  2 + dorsalis pedis  ,  normal symmetric pulses bilaterally  . HEENT  normocephalic  ,  oral mucosa  moist  .          **Assessments**    ---    1\. 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 Mild mitral regurgitation - I34.0    ---    3\.  LBBB (left bundle branch block) - I44.7    ---    4\. HFrEF (heart failure with reduced ejection fraction) - I50.20    ---      In summary, Stephanie [name] is a 80 yo woman with history of chronic HFrEF,  NICM, LBBB and mild MR who is now s/p CRT-D in 06/2018. She is asymptomatic  and her echocardiogram shows partial LVEF recovery s/p CRT from EF 35% to  45-50%, maintained today. She has had no recent hospitalizations and is  tolerating high doses of GDMT.    1\. Chronic systolic HF, partial LVEF recovery post-CRT: Stephanie [name] is feeling  well, is euvolemic and exam today and is tolerating excellent HFrEF GDMT with  97-103mg  Entresto bid and bisoprolol 5mg . Still NYHA class I with heart rate  and blood pressure are both at goal. We made no HF medication changes today  and will continue her current excellent doses of bisoprolol and Entresto. She  takes low-dose furosemide. She is not on spironolatone, which is an option  that we could consider for the future if she were to develop new symptoms,  although we would have to be confident that her serum potassium could be  closely monitored which is difficult when she is travelling. In light of  EMPEROR-preserved data, I did discuss the possibility of adding in  empagliflozin if any HF symptoms or fluid retention arise, or if she has an  elevated proBNP (not checked on the recent lab set as above. An SGLT2i would  also help her impaired glucose tolerance, although the A1c has recently  improved to 5.7% with diet and exercise. We reviewed HF symptoms to be  vigilent for, the importance of COVID-19 avoidance and vaccination, and  healthy exercise and nutrition plans.    The aspirin 81mg  is optional; she does not have DM and there is no data  supporting its use in NICM or for primary prevention fo CVD among women.    Thank you for involving [place] in the care of Stephanie [name]. We will see her back in  clinic in approximately 12 months, and are available for sooner consultation  if there  are any new symptoms or concerns.    ---      **Follow Up**    ---    12 Months    Electronically signed by Boris Lown , MD on 08/13/2020 at 02:09 AM EST    Sign off status: Completed        * * *        Cardiac Subspecialty    [address]    [place], [address]    [place], [place] [address]    Tel: [phone]    Fax: [phone]              * * *  Progress Note: Stephanie [name] 08/10/2020    ---    Note generated by eClinicalWorks EMR/PM Software ([email or web address])

## 2020-08-10 NOTE — Progress Notes (Signed)
.  Progress Notes  .  Patient: Stephanie [name], Stephanie [name]  Provider: VEST, Marchelle Folks R   .  DOB: September 27, 1950 Age: 69 Y Sex: Female  Date: 08/10/2020  .  --------------------------------------------------------------------------------  .  REASON FOR APPOINTMENT  .  1. HF 1 YEAR F/U ECHO 9AM  .  HISTORY OF PRESENT ILLNESS  .  GENERAL:   We had the pleasure to see Stephanie Stephanie [name] today in our clinic at  [facility]. Stephanie [name] is a 86 yo woman with history of  HTN, HLD and Graves disease sp radioactive ablation who presents  toady for follow-up regarding her NICM, chronic systolic HF and  LBBB. She is currently residing in the [place] where her sons live.  Stephanie [name]'s symptoms started in the early 63s. At that time she  developed SOB on exertion. She was able to walk a block and climb  20 steps before developing SOB. She denied orthopnea and PND. She  also complained of 'chest burning' that did not correlate with  exertion. Given the SOB and the chest burning, in 2008 she had  LHC that did not show evidence of coronary artery disease. She  also underwent echocardiogram that showed mild to moderate mitral  valve regurgitation and LVEDD at 5.6 cm. She was started on  atenolol and enalapril and she continued following with her  cardiologist periodically. In 2010 she was diagnosed with Graves  disease and she received radioactive iodine ablation. Her  symptoms had been stable up to two years ago when she started  having worsening SOB on exertion. She ended up having SOB on  minimal exeartion but she never developed PND, orthopnea or LE  edema. She underwent work up in [place], including a TEE that was  read as severe mitral regurgitation, for which she was referred  to a Careers adviser for [facility]. However on surgical review, the MR was  recognized to be less severe and functional in nature. Her LV was  more dilated compared to 2008 (6.1 cm) and her EF had dropped to  40%. She was started on bisoprolol, Entresto and furosemide and a  surgery was  not pursed. She was successfully uptitrated to max  dose 97/103mg  bid Entresto. She has followed annually with the HF  clinic with LVEF 35% in 2018 and 2019. Given the persistent LVEF  35% with NYHA class I-II symptoms and LBBB she was referred to Dr  Malcolm Metro and ultimately underwent CRT-D implantation on 07/08/2018.  Her LVEF improved therafter to 45-50% on echo. She was last seen  in clinic a year ago on 05/27/2019. Today, 08/10/20, Stephanie [name]  returns to clinic for routine follow-up and an echocardiogram.  She has had no interval hospitalizations or medical changes. She  has been mostly in the [place], and staying with the family locally  during the pandemic, has avoided any COVID-19 sympoms. She can  walk unlimited on the flat and has no symptoms when climbing a  flight of stairs. She has been watching her diet and trying to  exercise more, due to a prior hemoglobin A1c in the prediabetes  range, and is pleased to have lost about 12 pounds. She has not  been on an SGLT2 inhibitor in the past. She specifically denies  any dyspnea on exertion, orthopnea, LE edema, chest pain,  palpitations or dizziness. She is still tolerating the max  Entresto dose very well without significant dizziness or  lightheadness. There has been no ICD discharges and no  complications at the  CRT-D site. Has received three doses of  Covid vaccination.  Marland Kitchen  Heart Failure:  NYHA Class  .  -I  .  Marland Kitchen  CURRENT MEDICATIONS  .  Taking Aspirin 81 MG Tablet Chewable 1 tablet Orally Once a day  Taking Bisoprolol Fumarate 5 MG Tablet 1/2 tablet Orally Once a  day  Taking Caltrate 600+D3 600-800 MG-UNIT Tablet 1 tablet with a  meal Orally Once a day  Taking Entresto 97-103 MG Tablet 1 tablet Orally Twice a day  Taking Lasix 20 MG Tablet 1 tablet Orally Once a day  Taking Levothyroxine Sodium 100 MCG Tablet 1 tablet in the  morning on an empty stomach Orally Once a day, Notes: 100mg  5  days, 50 mg 1 day  Taking Lipitor 10 MG Tablet 1 tablet Orally Once a  day  Taking Omeprazole 10 MG Capsule Delayed Release 1 capsule Orally  Once a day  Taking Turmeric 500 MG Capsule 1 capsule Orally Once a day  Taking Vitamin D 50 MCG (2000 UT) Tablet 1 tablet Orally Once a  day  Medication List reviewed and reconciled with the patient  .  PAST MEDICAL HISTORY  .  Mild mitral regurgitation  Hypertension  Hyperlipidemia  Osteoarthritis  Graves disease sp radioactive ablation 2010  Nonischemic cardiomyopathy, chronic HFrEF  LBBB, now s/p CRT-D (06/2018)  Gastritis  .  ALLERGIES  .  None  .  SURGICAL HISTORY  .  lumpectomy  .  FAMILY HISTORY  .  2 son(s) , 1 daughter(s) - healthy.  Mother with CAD. Father deceased without history of heart  disease.  .  SOCIAL HISTORY  .  .  Tobaccohistory:Never smoked.  .  Marital Status Married.  .  Caffeine: 3 cups daily.  .  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  .  Alcohol Denies.  .  Patient denies tobacco, ETOH, or IVDUTwo sons are doctors Splits  time between [place] and [place] Pt cell [phone].  Marland Kitchen  HOSPITALIZATION/MAJOR DIAGNOSTIC PROCEDURE  .  No hospitalization for heart failure  .  REVIEW OF SYSTEMS  .  A 12-point review of systems was completed and negative unless  otherwise reported in the HPI.  Marland Kitchen  VITAL SIGNS  .  Pain scale 0, Ht-in 62, Wt-lbs 137.4, BMI 25.13, BP 117/78, HR  68, RR 16, BSA 1.65, O2 97%, Ht-cm 167.64, Wt-kg 62.32, Wt Change  -12.8 lb.  Marland Kitchen  EXAMINATION  .  Cardiac Testing:  EKG:08/10/20: A-sensed sinus rhythm, BiV paced  2019: Sinus rhythm,LBBB .  Echo:  .  08/10/2020: The left ventricle is normal in size. There is normal  left ventricular wall thickness. Left ventricular systolic  function is mildly reduced. Ejection Fraction = 45-50%. Global  longitudinal strain is - 15%. The tissue Doppler diastolic  parameters are indeterminate. There is mild global hypokinesis of  the left ventricle. The interventricular septum is asynchronous.  The right ventricle is normal in size and function. There is a  device lead  in the right ventricle. TAPSE is 1.8cm. The left  atrium is mildly dilated. Right atrial size is normal. The  inferior vena cava is normal size with > 50% respirophasic  variation, consistent with normal right atrial pressure. There is  no Color Doppler evidence for an atrial septal defect. There is  mild mitral annular calcification. The mitral valve leaflets  appear mildly thickened,. There is no mitral valve stenosis.  There is mild mitral regurgitation. The tricuspid valve is normal  in structure. There  is mild tricuspid regurgitation. Right  ventricular systolic pressure is normal. The aortic valve is  mildly thickened. The aortic valve is trileaflet. The aortic  valve opens well. No hemodynamically significant valvular aortic  stenosis. Mild aortic regurgitation. The pulmonic valve is not  well seen, but is grossly normal. Trace pulmonic valvular  regurgitation. The aortic root is normal size. The ascending  aorta is normal size. There is no pericardial effusion. Prominent  anterior fat pad.  .  05/27/2019: The left ventricle is normal in size. There is normal  left ventricular wall thickness. Left ventricular systolic  function ismildly reduced. Ejection Fraction = 45-50%. There is  mild global hypokinesis of the left ventricle.The right ventricle  is normal size. There is a device lead in the right ventricle.  The right ventricular systolic functionis normal. Mild MR.  .  01/2018: No change compare to prior, LVEF 35%  .  01/30/17: The left ventricle is mildly dilated. There is no  thrombus. There is normal left ventricular wall thickness. Left  ventricular systolic function is moderately reduced. Ejection  Fraction = 35%. The interventricular septum is asynchronous.  Basal-mid septal akinesis. The right ventricle is normal in size  and function. The left atrial size is normal. Right atrial size  is normal. A dilated inferior vena cava suggests increased right  atrial pressure. The mitral valve leaflets appear  mildly  thickened,. There is moderate mitral annular calcification. There  is mild mitral regurgitation. The tricuspid valve is normal.  There is mild tricuspid regurgitation. Right ventricular systolic  pressure is normal. The aortic valve is trileaflet. The aortic  valve opens well. Mild aortic regurgitation. The pulmonic valve  is not well seen, but is grossly normal. Trace pulmonic valvular  regurgitation. The aortic root is normal size. The ascending  aorta is normal size.There is no pericardial effusion.  .  03/14/16: The left ventricle is mildly dilated. There is no  thrombus. There is normal left ventricular wall thickness. Left  ventricular systolic function is moderately reduced.Ejection  Fraction = 35-40%. The interventricular septum is asynchronous.  Basal-mid septal akinesis. Mild mitral and aortic valve  regurgitation.  .  .  ICD Interrogation:08/10/20 CRT-D interrogation: Normal DDD CRT-D  function, no changes made to settings. Histogram shows 0.3%  atrial pacing, 98.3% bi-ventricular pacing, good heart rate  distribution, no new atrial or ventricular arrhythmias since last  visit in October 2020. LV capture threshold 2.75V at 0.26ms. Home  remote monitoring will continue. The patient will return to  clinic in 1 year.  .  05/27/19 CRT-D interrogation: Normal dual CRT ICD function, no new  atrial arrhythmias, no new ventricular arrhythmia detections . 27  % atrial pacing, 97 % biventricular pacing, good heart rate  distribution. Battery longevity is 7 years, home monitoring will  continue..  Cardiac Catheterization: [facility] (2008): without evidence of CAD per  patient report .  LabsJune 10 2019 local labs: Cr 0.8, K 4.8, normal ALT and ALK,  TSH 0.8, Hgb 14.3  A1c 6.0%; TSH 2.36, normal renal function and chem7, normal CBC,  normal CK  Sept 8 2020 local labs: glu 123, BUN 27, Cr 0.64, Na 142, K 4.7,  bicarb 26, Ca 9.1, LFTs normal, WBC 7.5, Hgb 14.4, plts 218,  Tchol 179, TG 102, HDl 42, LDL 117, TSH 3.21 Jul 2020: Cr 0.69, glu 106, K 4.1, Na 143, LDL 100, TSH 2.4, A1c  5.7%, Hgb 14.3  .  Marland Kitchen  PHYSICAL EXAMINATION  .  General Examination:  General Appearance  well developed, well nourished, alert,  oriented, in no acute distress. Head  normocephalic. Eyes  sclera  non-icteric. Oral Cavity  mucosa moist. Neck/Thyroid  carotid  pulse normal, no carotid bruit, no jugular venous distension.  Skin  no rashes. Heart  S1, S2 regular, 1/6 PSM, no added sounds  . Lungs  clear to auscultation bilaterally with no wheezing, no  crackles, no rhonchi. Chest  no costochondral tenderness. Abdomen   soft, non tender, non distended, bowel sounds present, no  abdominal or flank bruits, no organomegaly, no prominent  pulsation, no bruits. Extremities  no edema, no clubbing, no  cyanosis. Peripheral Pulses  2 + posterior tibial, 2 + radial, 2  + dorsalis pedis, normal symmetric pulses bilaterally. HEENT   normocephalic, oral mucosa moist.  .  ASSESSMENTS  .  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  Mild mitral regurgitation - I34.0  .  LBBB (left bundle branch block) - I44.7  .  HFrEF (heart failure with reduced ejection fraction) - I50.20  .  In summary, Stephanie Uffelman is a 36 yo woman with history of chronic  HFrEF, NICM, LBBB and mild MR who is now s/p CRT-D in 06/2018.  She is asymptomatic and her echocardiogram shows partial LVEF  recovery s/p CRT from EF 35% to 45-50%, maintained today. She has  had no recent hospitalizations and is tolerating high doses of  GDMT. 1. Chronic systolic HF, partial LVEF recovery post-CRT: Ms  Dunkerson is feeling well, is euvolemic and exam today and is  tolerating excellent HFrEF GDMT with 97-103mg  Entresto bid and  bisoprolol 5mg . Still NYHA class I with heart rate and blood  pressure are both at goal. We made no HF medication changes today  and will continue her current excellent doses of bisoprolol and  Entresto. She takes low-dose furosemide. She is not on  spironolatone, which is an option that we  could consider for the  future if she were to develop new symptoms, although we would  have to be confident that her serum potassium could be closely  monitored which is difficult when she is travelling. In light of  EMPEROR-preserved data, I did discuss the possibility of adding  in empagliflozin if any HF symptoms or fluid retention arise, or  if she has an elevated proBNP (not checked on the recent lab set  as above. An SGLT2i would also help her impaired glucose  tolerance, although the A1c has recently improved to 5.7% with  diet and exercise. We reviewed HF symptoms to be vigilent for,  the importance of COVID-19 avoidance and vaccination, and healthy  exercise and nutrition plans. The aspirin 81mg  is optional; she  does not have DM and there is no data supporting its use in NICM  or for primary prevention fo CVD among women. Thank you for  involving [place] in the care of Ms Llamas. We will see her back in  clinic in approximately 12 months, and are available for sooner  consultation if there are any new symptoms or concerns.  .  FOLLOW UP  .  12 Months  .  Electronically signed by Boris Lown , MD on  08/13/2020 at 02:09 AM EST  .  Document electronically signed by VEST, AMANDA R   .

## 2020-08-11 ENCOUNTER — Ambulatory Visit

## 2020-08-13 ENCOUNTER — Ambulatory Visit: Admitting: Internal Medicine

## 2020-08-13 ENCOUNTER — Ambulatory Visit (HOSPITAL_BASED_OUTPATIENT_CLINIC_OR_DEPARTMENT_OTHER): Admitting: Psychiatry

## 2020-08-13 NOTE — Progress Notes (Signed)
* * *      Stephanie [name], Stephanie [name] **DOB:** June 18, 1951 (69 yo F) **Acc No.** [id number] **DOS:**  08/13/2020    ---        Stephanie [name]**    ------    43 Y old Female, DOB: 1950-09-04    [address], [place], [place]    Home: [phone]    Provider: Xaden Kaufman, Dyanne Carrel        * * *    Telephone Encounter    ---    Answered by    Boris Lown R    Date: 08/13/2020        Time: 02:05 AM    Reason    Email notes please    ------            Message                      Hello Stephanie [name],      Please could you email Stephanie [name]'s clinic note, echo report and device report from 08/10/20 to her email of [name] (its wrong in the EMR, should be an E not a K) as per patient request please?      Thx Marchelle Folks                 Action Taken                      [name],Stephanie [name]  08/15/2020 9:24:06 AM > Email has been updated. Records has been faxed.                    * * *                ---          * * *          Provider: Jenesis Martin R 08/13/2020    ---    Note generated by eClinicalWorks EMR/PM Software ([email or web address])

## 2020-09-21 ENCOUNTER — Ambulatory Visit

## 2020-11-15 NOTE — Progress Notes (Signed)
* * *        **  Stephanie [name]**    --- ---    66 Y old Female, DOB: [birth year]    [address], [place], [place] [address]    Home: [phone]    Provider: Nishan Ovens, Dyanne Carrel, MD        * * *    Telephone Encounter    ---    Answered by   Boris Lown R  Date: 04/25/2018         Time: 09:37 PM    Message                      [employer] patient assistance form completed.         --- ---            Refills  Refill Entresto Tablet, 97-103 MG, Orally, 180, 1 tablet, Twice a  day, 90 days, Refills=4    --- ---          * * *                ---          * * *          PatientRoxan [name] DOB: 1951-07-17 Provider: Webb Laws, MD  04/25/2018    ---    Note generated by eClinicalWorks EMR/PM Software ([email or web address])

## 2020-11-15 NOTE — Progress Notes (Signed)
* * *        Roxan Hockey    --- ---    70 Y old Female, DOB: 1951/04/07, External MRN: [id number]    Account Number: [id number]    [address], [place], [id number]    Home: [phone]    Insurance: MEDICARE    PCP: Unknown Unknown Referring: Unknown Unknown    Appointment Facility: Cardiac Subspecialty        * * *    02/12/2018   **Appointment Provider:** Guy Begin* **CHN#:** [id number]    --- ---      **Supervising Provider:** AMANDA VEST    ---         **Reason for Appointment**    ---       1\. HF PT - ECHO @ 10AM    ---       **History of Present Illness**    ---     _GENERAL_ :    We had the pleasure to see Mrs Flinchum today in our clinic at [facility]. Mrs Justman is a 70 yo with history of HTN, HLD and Graves disease sp  radioactive ablation who presents toady for follow-up regarding her NICM,  chronic systolic HF and LBBB. She resides both in [place], where her primary  cardiologist is located, and the [place] where her sons live.    Mrs Norrington's symptoms started in the early 71s. At that time she developed SOB  on exertion. She was able to walk a block and climb 20 steps before developing  SOB. She denied orthopnea and PND. She also complained of "chest burning" that  did not correlated with exertion. Given the SOB and the chest burning, in 2008  she had LHC that did not show evidence of coronary artery disease. She also  underwent echocardiogram that showed mild to moderate mitral valve  regurgitation and LVEDD at 5.6 cm. She was started on atenolol and enalapril  and she continued following with her cardiologist periodically. In 2010 she  was diagnosed with Graves disease and she received radioactive iodine  ablation.    Her symptoms had been stable up to two years ago when she started having  worsening SOB on exertion. She ended up having SOB on minimal exeartion but  she never developed PND, orthopnea or LE edema. She underwent work up in  [place], including a TEE that was read as  severe mitral regurgitation, for  which she was referred to a Careers adviser for [facility]. However on surgical review, the  MR was recognized to be less severe and functional in nature. Her LV was more  dilated compared to 2008 (6.1 cm) and her EF had dropped to 40%. She was  started on bisoprolol, Entresto and furosemide and a surgery was not pursed.    Mrs Harper was last seen almost a year ago in 01/2017. She is back in the [place]  visiting family this summer and so presents for follow-up today 02/12/18. She  has been very well in the interim. She denies any symptoms and has not been  hospitalized. She can walk 25 mins comfortably, but exertion beyond this is  limited by fatigue. No dyspnea on exertion, orthopnea, palpitations or  dizziness. She is tolerating the max Entresto dose very well. She also denies  weight changes, LE edema. Her repeat echocardiogram is stable compared to  priors.     _Heart Failure_ :    NYHA Class    - _I-II_       **  Current Medications**    ---    Taking     * Aspirin 81 MG Tablet Chewable 1 tablet Orally Once a day    ---    * Bisoprolol Fumarate 5 MG Tablet 2.5mg  only Orally Once a day    ---    * Celebrex - Capsule Arcoxia (non-FDA approved) 90mg  Orally Once a day    ---    * Entresto 97-103 MG Tablet 1 tablet Orally Twice a day    ---    * Lasix 20 MG Tablet 1 tablet Orally Once a day    ---    * Levothyroxine Sodium 50 mcg Tablet 6 days and 1 day Orally Once a day    ---    * Lipitor 10 MG Tablet 1 tablet Orally Once a day    ---    * Omeprazole 10 MG Capsule Delayed Release 1 capsule Orally Once a day    ---    * Medication List reviewed and reconciled with the patient    ---       **Past Medical History**    ---       MIld mitral regurgitation.        ---    HTN.        ---    HLD.        ---    OA.        ---    Graves disease sp radioactive ablation.        ---    NICM.        ---    LBBB.        ---    Gastritis .        ---       **Surgical History**    ---       lumpectomy     ---      **Family History**    ---       Mother is alive. She has CAD. Father is deceased. No history of heart  disease.    ---       **Social History**    ---    Alcohol    _Denies_    Caffeine: 3 cups daily.    Tobacco    history: _Never smoked_   Patient denies tobacco, ETOH, or IVDU    Two sons are doctors    Primarily resides in [place].    ---       **Allergies**    ---       None    ---       **Hospitalization/Major Diagnostic Procedure**    ---       No hospitalization for heart failure    ---      **Review of Systems**    ---     _CardioVascular_ :    Neurologic no numbness or weakness, no visual disturbance, no difficulty  speaking, parasthesias. General/Constitutional no fever, chills, night sweats,  sleep disturbance, headaches, no change in appetite, no fatigue, no weight  gain/loss. Gastrointestinal no melena, no hematochezia, hematemses, no blood  in stool, nausea, vomiting, diarrhea, abdominal pain. Hematology no easy  bruising, no prolonged bleeding. Peripheral Vascular no claudication, no skin  ulceration. Endocrine no polydipsia, polyphagia, hot and cold intolerance.  Skin no rash. Cardiovascular no chest pain at rest or with exertion, no  dyspnea at rest or with exertion, no palpitations, no fluid accumulation in  the legs/edema, no increase  in abnormal girth, no presyncope, no syncope, no  orthopnea, no paroxysmal nocturnal dyspnea. Respiratory no cough, wheezing,  shortness of breath at rest, sore throat, upper airway congestion, or  hemoptysis. Musculoskeletal no arthraglias, myalgias, or muscle aches.          **Vital Signs**    ---    Pain scale 0, Ht-in 66, Wt-lbs 152, BMI 24.53, BP 124/64, HR 54, RR 16, BSA  1.79, O2 97, Ht-cm 167.64, Wt-kg 68.95, Wt Change -9 lb.       **Examination**    ---     _Cardiac Testing_ :    EKG:  Sinus rhythm. LBBB .    Echo:    01/2018: No change compare to prior    01/30/17:    The left ventricle is mildly dilated. There is no thrombus. There is normal  left  ventricular wall thickness. Left ventricular systolic function is  moderately reduced. Ejection Fraction = 35%. The interventricular septum is  asynchronous. Basal-mid septal akinesis. The right ventricle is normal in size  and function. The left atrial size is normal. Right atrial size is normal. A  dilated inferior vena cava suggests increased right atrial pressure. The  mitral valve leaflets appear mildly thickened,. There is moderate mitral  annular calcification. There is mild mitral regurgitation. The tricuspid valve  is normal. There is mild tricuspid regurgitation. Right ventricular systolic  pressure is normal. The aortic valve is trileaflet. The aortic valve opens  well. Mild aortic regurgitation. The pulmonic valve is not well seen, but is  grossly normal. Trace pulmonic valvular regurgitation. The aortic root is  normal size. The ascending aorta is normal size. There is no pericardial  effusion.        03/14/16: The left ventricle is mildly dilated. There is no thrombus. There is  normal left ventricular wall thickness. Left ventricular systolic function is  moderately reduced.Ejection Fraction = 35-40%. The interventricular septum is  asynchronous. Basal-mid septal akinesis. Mild mitral and aortic valve  regurgitation.    .    Cardiac Catheterization:  LHC (2008): without evidence of CAD per patient  report .    Labs January 27 2018 local labs: Cr 0.8, K 4.8, normal ALT and ALK, TSH 0.8, Hgb  14.3    A1c 6.0%        TSH 2.36, normal renal function and chem7, normal CBC, normal CK    .          **Physical Examination**    ---     _General Examination_ :    General Appearance well developed, well nourished, alert, oriented, in no  acute distress.    Head normocephalic.    Eyes sclera non-icteric.    Oral Cavity mucosa moist.    Neck/Thyroid carotid pulse normal, no carotid bruit, no jugular venous  distension.    Skin no rashes.    Heart S1, S2 regular, 1/6 PSM, no added sounds .    Lungs clear to  auscultation bilaterally with no wheezing, no crackles, no  rhonchi.    Chest no costochondral tenderness.    Abdomen soft, non tender, non distended, bowel sounds present, no abdominal or  flank bruits, no organomegaly, no prominent pulsation, no bruits.    Extremities no edema, no clubbing, no cyanosis.    Peripheral Pulses 2 + posterior tibial, 2 + radial, 2 + dorsalis pedis, normal  symmetric pulses bilaterally.    HEENT normocephalic, oral mucosa moist.          **  Assessments**    ---    1\. 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 Mild mitral regurgitation - I34.0    ---    3\. LBBB (left bundle branch block) - I44.7    ---      In summary, Mrs Dilday is a 70 yo with history of NICM, LBBB and mild MR who  is NYHA class I-II and euvolemic with chronic HFrEF. She has been stable over  the past year and is tolerating high doses of GDMT for HF with a stable LVEF  of 35% and mild MR. She does not have an ICD or CRT-D.    She has not yet received CRT-D, in part because her LVEF has been on the upper  limit of eligibility and in part because she is mostly NYHA class I, but we  discussed this option again in detail today. Given her LBBB with QRS , we  discussed that there may be scope for CRT-D to improve her LVEF and possibly  her functional capacity. We could perform a cardiac MRI and refer her for  consultation with our EP colleague Dr Ewell Poe while she is in the [place],  which she will consider and discuss with her family prior to making a  decision. Her sons (doctors) will also try to determine whether CRT-D or ICD  monitoring could be performed by her local cardiologist in [place].    We made no HF medication changes today and will continue her current excellent  doses of bisoprolol and Entresto. She is not on spironolatone, which is an  option that we could consider for the future. Labs from 2 weeks ago were good,  as above.    ---       **Follow Up**    ---    12 Months    **Appointment  Provider:** Gwenyth Bouillon [facility]*    Electronically signed by Boris Lown MD on 02/15/2018 at 10:07 PM EDT    Sign off status: Completed        * * *        Cardiac Subspecialty    [address]    [place], [address]    [place], [place] [address]    Tel: [phone]    Fax: [phone]              * * *          Patient: MCKENZEE, BEEM DOB: Aug 06, 1951 Progress Note: Guy Begin*  02/12/2018    ---    Note generated by eClinicalWorks EMR/PM Software ([email or web address])

## 2020-11-15 NOTE — Progress Notes (Signed)
* * *        Stephanie [name]**    --- ---    78 Y old Female, DOB: [birth year], External MRN: [id number]    Account Number: [id number]    [address], [place], [id number]    Home: [phone]    Insurance: MEDICARE Payer ID: PAPER    PCP: Unknown Unknown Referring: Unknown Unknown External Visit ID: [id number]    Appointment Facility: [facility]        * * *    03/03/2018  Progress Notes: Guerry Minors, MD **CHN#:** [id number]    --- ---    ---         **Current Medications**    ---    Taking     * Aspirin 81 MG Tablet Chewable 1 tablet Orally Once a day    ---    * Bisoprolol Fumarate 5 MG Tablet 2.5mg  only Orally Once a day    ---    * Celebrex - Capsule Arcoxia (non-FDA approved) 90mg  Orally Once a day    ---    * Entresto 97-103 MG Tablet 1 tablet Orally Twice a day    ---    * Lasix 20 MG Tablet 1 tablet Orally Once a day    ---    * Levothyroxine Sodium 50 mcg Tablet 6 days and 1 day Orally Once a day    ---    * Lipitor 10 MG Tablet 1 tablet Orally Once a day    ---    * Omeprazole 10 MG Capsule Delayed Release 1 capsule Orally Once a day    ---      Past Medical History    ---       MIld mitral regurgitation.        ---    HTN.        ---    HLD.        ---    OA.        ---    Graves disease sp radioactive ablation 2010.Marland Kitchen        ---    NICM.        ---    LBBB.        ---    Gastritis .        ---       **Surgical History**    ---       lumpectomy    ---      **Family History**    ---       2 son(s) , 1 daughter(s) - healthy.    ---    Mother is alive. She has CAD. Father is deceased. No history of heart disease.    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    Alcohol  Denies.    Caffeine: 3 cups daily.    Marital Status  Married.    Tobacco history: Never smoked.   Patient denies tobacco, ETOH, or IVDU    Two sons are doctors    Primarily resides in [place].    ---       **Allergies**    ---       None    ---    [Allergies  Verified]       **Hospitalization/Major Diagnostic Procedure**    ---       No hospitalization for heart failure    ---      **  Review of Systems**    ---     _Cardiology - [facility]_ :    Constitutional no weight gain or loss in the past year, no night sweats or  fevers, no loss of appetite, no feelings of fatigue. Gastrointestinal no  swallowing difficulty, no nausea or vomiting, no constipation, no diarrhea, no  black stool, no blood in stool. Genital/Urinary no pain, burning, or  difficulty with urination, no increase in urinary frequency, no blood in  urine. Musculoskeletal \+ joint pain or swelling, + back pain, no arm or leg  weakness. Neurology/Psychiatry no dizziness or lightheadedness, no unsteady  gait, no numbness or tingling sensation, no headaches, no forgetfulness, no  anxious or nervous feelings, no insomnia, no feeling of being too sleepy all  the time.            **Reason for Appointment**    ---       1\. Cardiomyopathy    ---       **History of Present Illness**    ---     _Heart Failure_ :    NYHA Class \- I-II.    _Cardiology_ :    Thank you for referring [name] for evaluation of her candidacy for  a CRT-D. I had the pleasure to meet [name] at the [facility] on Monday, March 03, 2018 in the company of her husband and  son, a physician. As you recall, [name] is a 70 yo female patient who has  had a long-standing history of exertional dyspnea and chest discomfort. She  splits her time between her home in [place], [place] and here in the  [place] where her son lives in [place] and her daughter in [place].    She was diagnosed with a left bundle branch block, cardiac catheterization in  2008 failed to demonstrate any occlusive disease. At some point she was  diagnosed with mitral regurgitation and was in fact referred for mitral valve  repair/replacement before the cardiothoracic surgeon determined that her  mitral regurgitation was  functional due to her underlying cardiomyopathy. She  was started on atenolol and enalapril and she continued following with her  cardiologist periodically. In 2017 she had an LVEF 35-40% with a mildly  dilated left ventricle and mild mitral regurgitation.    Over the course of the last 2 years, her medical therapy has been optimized.  She can walk 25 mins comfortably, but exertion beyond this is limited by  fatigue. No dyspnea on exertion, orthopnea, palpitations or dizziness. She  denies any history of presyncope or syncope. She is tolerating the max  Entresto dose very well. She also denies weight changes, LE edema. Her most  recent echocardiogram on February 12, 2018 showed a normal-sized left ventricular  cavity with an LVEF 35% and mild mitral regurgitation. No significant change  was found. When compared to its predecessor on January 30, 2017.       **Vital Signs**    ---    Pain scale 5, Ht-in 62, Wt-lbs 147, BMI 26.88, BP 128/72, HR 68, SaO2 98%.       **Examination**    ---     _Cardiac Testing_ :    EKG: (02/12/2018): Sinus rhythm, LBBB, QRS 160 ms.    Echo: (02/12/18): The left ventricle is normal in size. There is no thrombus.  There is normal left ventricular wall thickness. Left ventricular systolic  function is moderately reduced. Ejection Fraction =  35%. There is moderate  global hypokinesis of the left ventricle. The interventricular septum is  asynchronous. Basal-mid septum severely hypokinetic. The right ventricle is  normal in size and function. The left atrium is mildly dilated. Right atrial  size is normal. The inferior vena cava is normal size with > 50% respirophasic  variation, consistent with normal right atrial pressure. The mitral valve  leaflets appear mildly thickened,. There is moderate mitral annular  calcification. There is mild mitral regurgitation. The tricuspid valve  leaflets are thin and pliable. There is trace tricuspid regurgitation. Right  ventricular systolic pressure is normal. The  aortic valve is trileaflet. The  aortic valve opens well. Mild aortic regurgitatio. The pulmonic valve is not  well seen, but is grossly normal. Trace pulmonic valvular regurgitation. The  aortic root is normal size. The ascending aorta is normal size. There is no  pericardial effusion.    Cardiac Catheterization:  LHC (2008): without evidence of CAD per patient  report .          **Physical Examination**    ---     _Exam_ :    General no apparent distress. HEENT PERRLA, EOMI, no JVD, carotid pulses 2+  bilaterally, no bruits. Cardiovascular regular rate and rhythm, clear S1 and  S2, no murmurs, no gallops, no rubs, no heaves, no thrills. Pulmonary clear to  auscultation and percussion, no wheezing, no rhonchi, no rales. Abdomen soft,  non tender, non distended, no organomegaly. Extremities warm, well perfused,  no edema, no clubbing, no cyanosis. Neurologic non focal.          **Assessments**    ---    1\. 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 Mild mitral regurgitation - I34.0    ---    3\. LBBB (left bundle branch block) - I44.7    ---      Mrs. Stephanie [name] is a 70 year old female patient with a nonischemic  cardiomyopathy and a left bundle branch block with a QRS width of 160 ms. and  an LVEF 35%. It is difficult to determine the extent to which a CRT-D would  confer symptomatic benefit and/or protection from sudden cardiac death based  on the paucity of symptoms. It is unclear as to whether she is functionally  impaired by her cardiomyopathy or by her diffuse arthritis. Based on the  "ACCF/HRS/AHA/ASE/HFSA/SCAI/SCCT/SCMR 2013 Appropriate Use Criteria for  Implantable Cardioverter-Defibrillators and Cardiac Resynchronization Therapy"  (Heart Rhythm, Vol 10, No 4, April 2013), if we believe that she is completely  asymptomatic, i.e. NYHA class I the implantation of a CRT-D is considered "may  be appropriate" and is allocated a score of 6/10. If, on the other hand, she  is considered a NYHA class  II and then she falls under the "appropriate" use  category with a score of 8/10. I reviewed this information with Mrs. Stephanie [name]  and she seemed to be in favor of proceeding with an implant more out of fear  of overlooking the risk of SCD. I offered, if she were to remain undetermined,  a two-week monitor looking for any evidence of ventricular tachyarrhythmias.  However, she preferred to further deliberate the option of proceeding now or  upon her return in the winter or next summer with an implant, with her family.  Her family expressed concern about the availability of followup for a CRT-D  either in [place] or in [place], [place]. I offered to help in finding  appropriate followup for  her locally. Mrs. Stephanie [name] would like to deliberate  this further with her family including her 2 sons, one of whom is a  cardiologist in [place] before making a final determination.    ---       **Treatment**    ---       **1\. Non-ischemic cardiomyopathy**    Continue Bisoprolol Fumarate Tablet, 5 MG, 2.5mg  only, Orally, Once a day    Continue Entresto Tablet, 97-103 MG, 1 tablet, Orally, Twice a day    Continue Lasix Tablet, 20 MG, 1 tablet, Orally, Once a day    ---       **Follow Up**    ---    prn    Electronically signed by Guerry Minors , MD on 03/07/2018 at 07:22 PM EDT    Sign off status: Completed        * * *        [facility]    [address]    [place], [address]    [place], [place] [address]    Tel: [phone]    Fax: [phone]              * * *          Patient: AYNSLEY, FLEET DOB: [birth year] Progress Note: Guerry Minors, MD  03/03/2018    ---    Note generated by eClinicalWorks EMR/PM Software ([email or web address])

## 2020-11-15 NOTE — Progress Notes (Signed)
* * *        Roxan Hockey    --- ---    70 Y old Female, DOB: [birth year], External MRN: [id number]    Account Number: [id number]    [address], [place], [id number]    Home: [phone]    Insurance: MEDICARE    PCP: Unknown Unknown Referring: Unknown Unknown    Appointment Facility: Cardiac [facility]specialty        * * *    01/30/2017  Progress Notes: Gavina Dildine **CHN#:** [id number]    --- ---    ---        Reason for Appointment    ---      1\. CHF F/U APPT. / ECHO @ 1PM    ---      History of Present Illness    ---     _GENERAL_ :    We had the pleasure to see Mrs Newmann today in our clinic at [facility]. Mrs Dercole is a 70 yo with history of HTN, HLD and graves disease sp  radioactive ablation who presents with SOB on exertion.    Mrs Scheeler's symptoms started in the early 30s. At that time she developed SOB  on exertion. She was able to walk a block adn climb 20 steps before developing  SOB. She denied orthopnea and PND. She also complained of "chest burning" that  did not correlated with exertion. Given the SOB and the chest burning, in 2008  she had LHC that did not show evidence of coronary artery disease. She also  underwent echocardiogram that showed mild to moderate mitral valve  regurgitation and LVEDD at 5.6 cm. She was started on atenolol and enalapril  and she continued following with her cardiologist periodically. In 2010 she  was diagnosed with Graves disease and she received radioactive iodine  ablation.    Her symptoms had been stable up to a year ago when she started having  worsening SOB on exertion. She ended up having SOB on minimal exeartion but  she never developed PND, orthopnea or LE edema. She underwent work up in her  home country of [place], including a TEE that was read as severe mitral  regurgitation, for which she was referred to a Careers adviser for MVr. However on  surgical review, the MR was recognized to be less severe and functional in  nature. Her LV was more dilated  compared to 2008 (6.1 cm) and her EF had  dropped to 40%. The patient was started on bisoprolol, entresto and lasix  about three months ago.    Mrs Munce was last seen almost a year ago in 03/14/16. She has been back in  the [place] visiting family this summer and so presents for follow-u today 01/30/17.  SHe has been very well in the interim. Her husband has been undergoing cancer  therapy and so she has been busy supporting him but she denies any exercise  limitations. Can walk 25 mins comfortably. No dyspnea on exertion, orthopnea,  palps or dizziness. She is tolerating the max Entresto dose very well. She  also denies weight changes, LE edema. She continues to have the same chest  burning that she had back in 2008 when her LHC was normal and had an  EGD/colonscopy locally recently with confirmation of gastritis as the chest  pain etiology.      Current Medications    ---    Taking     * Aspirin 81 MG Tablet Chewable  1 tablet Orally Once a day    ---    * Bisoprolol Fumarate 5 MG Tablet 2.5mg  only Orally Once a day    ---    * Celebrex - Capsule Arcoxia (non-FDA approved) 90mg  Orally Once a day    ---    * Entresto 97-103 MG Tablet 1 tablet Orally Twice a day    ---    * Lasix 20 MG Tablet 1 tablet Orally Once a day    ---    * Levothyroxine Sodium 50 mcg Tablet 6 days and 1 day Orally Once a day    ---    * Lipitor 10 MG Tablet 1 tablet Orally Once a day    ---    * Omeprazole 10 MG Capsule Delayed Release 1 capsule Orally Once a day    ---    * Medication List reviewed and reconciled with the patient    ---      Past Medical History    ---       MIld mitral regurgitation.        ---    HTN.        ---    HLD.        ---    OA.        ---    Graves disease sp radioactive ablation.        ---    NICM.        ---    LBBB.        ---    Gastritis .        ---      Surgical History    ---      lumpectomy    ---      Family History    ---      Mother is alive. She has CAD. Father is deceased. No history of  heart  disease.    ---      Social History    ---    Tobacco  history: Never smoked.    Caffeine: 3 cups daily.    Alcohol  Denies.   Patient denies tobacco, ETOH, or IVDU    Two sons are doctors    Primarily resides in [place].    ---      Allergies    ---      None    ---      Hospitalization/Major Diagnostic Procedure    ---      No hospitalization for heart failure    ---      Review of Systems    ---     _CardioVascular_ :    Neurologic no numbness or weakness, no visual disturbance, no difficulty  speaking, parasthesias. General/Constitutional no fever, chills, night sweats,  sleep disturbance, headaches, no change in appetite, no fatigue, no weight  gain/loss. Gastrointestinal no melena, no hematochezia, hematemses, no blood  in stool, nausea, vomiting, diarrhea, abdominal pain. Hematology no easy  bruising, no prolonged bleeding. Peripheral Vascular no claudication, no skin  ulceration. Endocrine no polydipsia, polyphagia, hot and cold intolerance.  Skin no rash. Cardiovascular no chest pain at rest or with exertion, no  dyspnea at rest or with exertion, no palpitations, no fluid accumulation in  the legs/edema, no increase in abnormal girth, no presyncope, no syncope, no  orthopnea, no paroxysmal nocturnal dyspnea. Respiratory no cough, wheezing,  shortness of breath at rest, sore throat, upper airway congestion, or  hemoptysis. Musculoskeletal  no arthraglias, myalgias, or muscle aches.          Vital Signs    ---    98% RA, 62 bpm    121/69    68.5kg = 151lbs.      Examination    ---     _Cardiac Testing_ :    EKG: Sinus rhythm. LBBB .    Echo:    01/30/17:    The left ventricle is mildly dilated. There is no thrombus. There is normal  left ventricular wall thickness. Left ventricular systolic function is  moderately reduced. Ejection Fraction = 35%. The interventricular septum is  asynchronous. Basal-mid septal akinesis. The right ventricle is normal in size  and function. The left atrial size is normal.  Right atrial size is normal. A  dilated inferior vena cava suggests increased right atrial pressure. The  mitral valve leaflets appear mildly thickened,. There is moderate mitral  annular calcification. There is mild mitral regurgitation. The tricuspid valve  is normal. There is mild tricuspid regurgitation. Right ventricular systolic  pressure is normal. The aortic valve is trileaflet. The aortic valve opens  well. Mild aortic regurgitation. The pulmonic valve is not well seen, but is  grossly normal. Trace pulmonic valvular regurgitation. The aortic root is  normal size. The ascending aorta is normal size. There is no pericardial  effusion.        03/14/16: The left ventricle is mildly dilated. There is no thrombus. There is  normal left ventricular wall thickness. Left ventricular systolic function is  moderately reduced.Ejection Fraction = 35-40%. The interventricular septum is  asynchronous. Basal-mid septal akinesis. Mild mitral and aortic valve  regurgitation..    Cardiac Catheterization: LHC (2008): without evidence of CAD per patient  report .    Labs TSH 2.36, normal renal function and chem7, normal CBC, normal CK    .          Physical Examination    ---     _General Examination_ :    General Appearance well developed, well nourished, alert, oriented, in no  acute distress. Head normocephalic. Eyes sclera non-icteric. Oral Cavity  mucosa moist. Neck/Thyroid carotid pulse normal, no carotid bruit, no jugular  venous distension. Skin no rashes. Heart S1, S2 regular, no murmurs, rub or  gallops. Lungs clear to auscultation bilaterally with no wheezing, no  crackles, no rhonchi. Chest no costochondral tenderness. Abdomen soft, non  tender, non distended, bowel sounds present, no abdominal or flank bruits, no  organomegaly, no prominent pulsation, no bruits. Extremities no edema, no  clubbing, no cyanosis. Peripheral Pulses 2 + posterior tibial, 2 + radial, 2 +  dorsalis pedis, normal symmetric pulses  bilaterally. HEENT normocephalic, oral  mucosa moist.          Assessments    ---    1\. 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 Mild mitral regurgitation - I34.0    ---    3\. LBBB (left bundle branch block) - I44.7    ---      In summary, Mrs Fobes is a 70 yo with history of NICM, LBBB and mild MR who  is NYHA class I for HFrEF. She has been stable over the past year and is  tolerating high doses of GDMT for HF with a stable LVEF of 35% and mild MR.  She does not have an ICD.    We have not yet performed an MRI and that could still be  a useful tool if  there is a change in her symptoms or cardiac structure, but there is currently  no clear indication. She has not yet received an ICD, in part because her LVEF  has been on the upper limit of eligibility and in part because she is NYHA  class I, but we should continue to assess this. Given her LBBB with QRS ,  she would be offered CRT-D if she became eligible for dvice therapy.    We made no HF medication changes today and will continue her current doses of  bisoprolol and Entresto. She is not on spironolatone, which we could consider  for the future. Labs are stable: Cr 0.75, K 4.6, A1c 6%, LDL 104, normal CBC.    Her TSH is now slighlty low at 0.28. I gave Mrs Egger verbal and written  instructions to decrease her levothyroxine doses to alternating / 100mg   dose.    ---      Follow Up    ---    6-12 Months    Electronically signed by Boris Lown MD on 02/07/2017 at 03:32 AM EDT    Sign off status: Completed        * * *        Cardiac Subspecialty    [address]    [place], [place] [address]    Tel: [phone]    Fax: [phone]              * * *          Patient: XIAN, APOSTOL DOB: 1951-06-07 Progress Note: Jakyrah Holladay  01/30/2017    ---    Note generated by eClinicalWorks EMR/PM Software ([email or web address])

## 2020-11-15 NOTE — Progress Notes (Signed)
* * *        Stephanie [name]    --- ---    70 Y old Female, DOB: Aug 14, 1951, External MRN: [id number]    Account Number: [id number]    [address] [place], [id number]    Home: [phone]    Insurance: MEDICARE    Referring: Unknown Unknown    Appointment Facility: Cardiac Subspecialty        * * *    03/14/2016  Progress Notes: Tahira Olivarez **CHN#:** [id number]    --- ---    ---        Reason for Appointment    ---      1\. Shortness of breath on exertion    ---    2\. Heart failure reduced EF, new patient    ---      History of Present Illness    ---     _GENERAL_ :    We had the pleasure to see Stephanie [name] today in our clinic at [facility]. Stephanie [name] is a 70 yo with history of HTN, HLD and graves disease sp  radioactive ablation who presents with SOB on exertion.    Stephanie [name]'s symptoms started in the early 70s. At that time she developed SOB  on exertion. She was able to walk a block adn climb 20 steps before developing  SOB. She denied orthopnea and PND. She also complained of "chest burning" that  did not correlated with exertion. Given the SOB and the chest burning, in 2008  she had LHC that did not show evidence of coronary artery disease. She also  underwent echocardiogram that showed mild to moderate mitral valve  regurgitation and LVEDD at 5.6 cm. She was started on atenolol and enalapril  and she continued following with her cardiologist periodically. In 2010 she  was diagnosed with Graves disease and she received radioactive iodine  ablation.    Her symptoms had been stable up to a year ago when she started having  worsening SOB on exertion. She ended up having SOB on minimal exeartion but  she never developed PND, orthopnea or LE edema. She underwent work up in her  home country of [place], including a TEE that was read as severe mitral  regurgitation, for which she was referred to a Careers adviser for MVr. However on  surgical review, the MR was recognized to be less severe and functional  in  nature. Her LV was more dilated compared to 2008 (6.1 cm) and her EF had  dropped to 40%. The patient was started on bisoprolol, entresto and lasix  about three months ago.    Today (03/14/16) she presents to our clinic accompanied by her son and her  husband. She reports significant improvement of her symptoms since initiation  of bisoprolol, entresto and lasix. She continues having some SOB on exertion  but it is not limiting and she denies PND and orthopnea. She does however  report that she is not as active as she usedx to be and might be holding back  on pushing herself due to the cardiac condition. She also denies weight  changes, LE edema. She continues to have the same chest burning that she had  back in 2008 when her LHC was normal. She denies history of MI or history of  rheumatic fever. She is over in the [place] visiting for the summer, and will be  back again in the late fall.      Current Medications    ---  Taking     * Aspirin 81 MG Tablet Chewable 1 tablet Once a day    ---    * Bisoprolol Fumarate 5 mg 1 tab 1/2 tab Once a day    ---    * Entresto 49-51 MG Tablet 1 tablet Twice a day    ---    * Lasix 20 MG Tablet 1 tablet Once a day    ---    * Levothyroxine Sodium 50 MCG Tablet 1 tablet on an empty stomach in the morning Once a day    ---    * Lipitor 10 MG Tablet 1 tablet Once a day    ---    * Omeprazole 10 MG Capsule Delayed Release 1 capsule Once a day    ---    * Medication List reviewed and reconciled with the patient    ---      Past Medical History    ---       Mild-moderate mitral regurgitation.        ---    HTN.        ---    HLD.        ---    OA.        ---    Graves disease sp radioactive ablation.        ---    NICM.        ---    LBBB.        ---      Surgical History    ---      lumpectomy    ---      Family History    ---      Mother is alive. She has CAD. Father is deceased. No history of heart  disease.    ---      Social History    ---    Tobacco  history: Never smoked.     Caffeine: 3 cups daily.    Alcohol  Denies.   Patient denies tobacco, ETOH, or IVDU.    ---      Allergies    ---      None    ---      Hospitalization/Major Diagnostic Procedure    ---      No hospitalization for heart failure    ---      Review of Systems    ---     _CardioVascular_ :    Neurologic no numbness or weakness, no visual disturbance, no difficulty  speaking, parasthesias. General/Constitutional no fever, chills, night sweats,  sleep disturbance, headaches, no change in appetite, no fatigue, no weight  gain/loss. Gastrointestinal no melena, no hematochezia, hematemses, no blood  in stool, nausea, vomiting, diarrhea, abdominal pain. Hematology no easy  bruising, no prolonged bleeding. Peripheral Vascular no claudication, no skin  ulceration. Endocrine no polydipsia, polyphagia, hot and cold intolerance.  Skin no rash. Cardiovascular no chest pain at rest or with exertion, no  dyspnea at rest or with exertion, no palpitations, no fluid accumulation in  the legs/edema, no increase in abnormal girth, no presyncope, no syncope, no  orthopnea, no paroxysmal nocturnal dyspnea. Respiratory no cough, wheezing,  shortness of breath at rest, sore throat, upper airway congestion, or  hemoptysis. Musculoskeletal no arthraglias, myalgias, or muscle aches.          Vital Signs    ---    Pain scale 0, Ht-in 66, Wt-lbs 161, BMI 25.98, BP 139/82, HR 73, RR 16,  BSA  1.84, Ht-cm 167.64, Wt-kg 73.03, SaO2 98 %.      Examination    ---     _Cardiac Testing_ :    EKG: Sinus rhythm. LBBB .    Echo:    Echocardiogram (03/14/16)    The left ventricle is mildly dilated. There is no thrombus. There is normal  left ventricular wall thickness. Left ventricular systolic function is  moderately reduced.Ejection Fraction = 35-40%. The interventricular septum is  asynchronous. Basal-mid septal akinesis.    Mild mitral and aortic valve regurgitation.          .    Cardiac Catheterization: LHC (2008): without evidence of CAD per  patient  report .    Labs TSH 2.36, normal renal function and chem7, normal CBC, normal CK    .          Physical Examination    ---     _General Examination_ :    General Appearance well developed, well nourished, alert, oriented, in no  acute distress. Head normocephalic. Eyes sclera non-icteric. Oral Cavity  mucosa moist. Neck/Thyroid carotid pulse normal, no carotid bruit, no jugular  venous distension. Skin no rashes. Heart S1, S2 regular, no murmurs, rub or  gallops. Lungs clear to auscultation bilaterally with no wheezing, no  crackles, no rhonchi. Chest no costochondral tenderness. Abdomen soft, non  tender, non distended, bowel sounds present, no abdominal or flank bruits, no  organomegaly, no prominent pulsation, no bruits. Extremities no edema, no  clubbing, no cyanosis. Peripheral Pulses 2 + posterior tibial, 2 + radial, 2 +  dorsalis pedis, normal symmetric pulses bilaterally. HEENT normocephalic, oral  mucosa moist.          Assessments    ---    1\. 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 Mild mitral regurgitation - I34.0    ---    3\. LBBB (left bundle branch block) - I44.7    ---      In summary, Stephanie [name] is a 70 yo with history of HTN, HLD and HFrEF who  presents to establish care. She had a LHC in 2008 when she was diagnosed with  HFrEF that was normal, suggesting that the etiology of her heart failure is  non-ischemic cardiomyopathy. She also has a long-standing LBBB. Initially it  was thought that her HF was due to primary MR, however in the recent  echocardiogram she appears to have functional MV regurgitation due to LV  dilation and asynchrony of the septum. Tachycardia induced cardiomyopathy  could be in our differential in the setting of Graves disease. However this is  less likely given the fact that it is anticipated to be a recoverable  cardiomyopathy and pt continues to have HFrEF despite adequate rate control.    Patient is currently NYHA II, class C. She appears  euvolemic on physical exam.  Today's echocardiogram showed decreased EF at 35-40%, mildly dilated LV and  mild MR and AI. Our goal is to optimize medical management and then perform a  cardiac MRI for better characterization of the cardiac structure. The patient  is having LBBB with QRS of 150 msec. If she remains symptomatic despite  optimal medical therapy and if there is any downtrending of the LVEF she would  be a candidate for ressynchronization therapy.    The plan for today is:    - We will discontinue calcium channel blocker in order to have room for  optimization of  Stephanie [name]    - We plan to increase Entresto to two tablets BID (equivalent to the 97/101mg   dose). We educated the patient about adverse effects of Entresto and we asked  her to decrease the dose back to 1 tab bid and call our clinic if she develops  dizziness, lightheadedness, fatigue.    - If patient tolerates the increased dose of entresto we will add  spironolactone at the next visit    - Continue bisoprolol - littel room for uptitration given that HR is 70bpm    - Continue lasix at current dose    - Basic metabolic panel and BNP in a week (gave pt a slip)    - Cardiac MRI before her next appointment in December to reassess LVEF and  for scaring in the septal region to explain LBBB    - She may benefit from cardiac rehab - she will do cardiac rehab once she  returns back to [place] in a month.    - Follow up in 4-5 months during next visit to [place].    ---      Treatment    ---       **1\. Others**    Notes: I personally interviewed and examined the patient and both the fellow  (Dr Tiburcio Bash and I contributed to this electronic note. I agree with  the history, exam, assessment and plan as detailed in this note and edited it  as necessary. I spent 70 minutes total in this patient encounter, of which  >50% was counselling and coordination of care.    ---      Follow Up    ---    4-5 Months    Electronically signed by Boris Lown MD on  03/17/2016 at 09:47 PM EDT    Sign off status: Completed        * * *        Cardiac Subspecialty    [address]    [place], [place] [address]    Tel: [phone]    Fax: [phone]              * * *          Patient: Stephanie [name], Stephanie [name] DOB: Feb 03, 1951 Progress Note: Stephanie [name]  03/14/2016    ---    Note generated by eClinicalWorks EMR/PM Software ([email or web address])

## 2020-11-15 NOTE — Progress Notes (Signed)
* * *        **  Stephanie [name]**    --- ---    75 Y old Female, DOB: 11-27-1950    [address], [place], [place] [address]    Home: [phone]    Provider: Dequavious Harshberger, Dyanne Carrel, MD        * * *    Telephone Encounter    ---    Answered by   Webb Laws  Date: 03/17/2018         Time: 05:44 PM    Message                      Called Ms Roberts on [phone] with MRI and CPET results.         --- ---            Action Taken                      Webb Laws, MD 03/17/2018 5:44:58 PM >       [place]un[place] and Fuller [place] I just spoke with Ms Traber. We reviewed the LVEF 30%, no myocardial scarring, and only a very mildly impaired response to exercise on the CPET. We reviewed the pros and the cons of CRT-D and she understands that we wouldn't particularly expect the device to achieve any cardiovascular symptom improvement, due to her good current HF functional status, but that prevention of SCD and potential for longer-term stabilization of LVEF would be the indications for proceeding. She seems to have a good understanding of these considerations.       Fuller [place] - do let me know if there are any residual questions, or any new concerns that come up over the coming months. I told her that she can always give me a call if she has new thoughts or qus between now and November.      Best wishes,      Marchelle Folks                           * * *                ---          * * *          Patient: Stephanie [name] DOB: [birth year] Provider: Webb Laws, MD  03/17/2018    ---    Note generated by eClinicalWorks EMR/PM Software ([email or web address])

## 2020-11-15 NOTE — Progress Notes (Signed)
* * *        **  Stephanie [name]**    --- ---    74 Y old Female, DOB: 07-Feb-1951    [address], [place], [place] [id number]    Home: [phone]    Provider: Kaliel Bolds, Dyanne Carrel, MD        * * *    Telephone Encounter    ---    Answered by   Thierry Dobosz, Marchelle Folks R  Date: 08/27/2017         Time: 11:42 AM    Refills  Refill Entresto Tablet, 97-103 MG, Orally, 180, 1 tablet, Twice a  day, 90 days, Refills=4    --- ---          * * *                ---          * * *          PatientRoxan [name] DOB: Dec 23, 1950 Provider: Webb Laws, MD  08/27/2017    ---    Note generated by eClinicalWorks EMR/PM Software ([email or web address])

## 2020-11-15 NOTE — Progress Notes (Signed)
* * *        **  Stephanie [name]**    --- ---    20 Y old Female, DOB: 1951-06-12    [address] [place], [place] [address]    Home: [phone]    Provider: Nyasha Rahilly, Dyanne Carrel, MD        * * *    Telephone Encounter    ---    Answered by   Boris Lown R  Date: 03/20/2016         Time: 10:09 AM    Message                      Called Stephanie [name] back on [phone] to relay the final read on her echo report to her. Plan remains for MRI and clinic f/up when she visits again in December. Currenlty LVEF above the threshold for CRT-D, but will reassess. Entresto increase is going okay thus far on day 2. She will do labs locally on Saturday.       She asked that I mail her the clinic note and send a script for the higher Entresto dose for when she needs a refill - she will send me an address and pharmacy to do so.         --- ---            Action Taken   Thanks [place]. The labs arrived this afternoon - please can you  pass along the message to your mom that they all look great and no med changes  are needed (Cr 0.69, K 4.9, BNP 32, Na 144). Is she doing okay in terms of  blood pressure and symptoms? I will mail out her clinic note to your address,  so that she can take it to her cardiologist back home, and send a refill for  the higher dose of Entresto to the [facility] in case she starts to run out of pills.  Best wishes, Marchelle Folks From: Arne Cleveland Sent: Monday, March 26, 2016 6:59 PM  To: Boris Lown Subject: Re: my mom Stephanie [name], I hope you're doing well.  Thanks for the birthday wishes by the way. My mom did her labs on Saturday, so  they should be heading your way soon. She mentioned that you needed my address  and a pharmacy: Address is: [address] [place], [place] [address] [facility] [address] [place], [place] [address] Tel: [phone] Thanks            Refills  Refill Entresto Tablet, 97-103 MG, Orally, 60, 1 tablet, Twice a day,  30 days, Refills=11    --- ---          * * *                ---          * * *           PatientRoxan [name] DOB: 1951-03-21 Provider: Webb Laws, MD  03/20/2016    ---    Note generated by eClinicalWorks EMR/PM Software ([email or web address])

## 2020-11-15 NOTE — Progress Notes (Signed)
* * *        **  Stephanie [name]**    --- ---    81 Y old Female, DOB: 28-Feb-1951    [address], [place], [place] [address]    Home: [phone]    Provider: Paetyn Pietrzak, Dyanne Carrel, MD        * * *    Telephone Encounter    ---    Answered by   Gina Costilla R  Date: 04/20/2016         Time: 06:21 PM    Refills  Refill Entresto Tablet, 97-103 MG, Orally, 60, 1 tablet, Twice a day,  30 days, Refills=11    --- ---          * * *                ---          * * *          PatientRoxan [name] DOB: 1951/05/31 Provider: Webb Laws, MD  04/20/2016    ---    Note generated by eClinicalWorks EMR/PM Software ([email or web address])

## 2021-02-13 ENCOUNTER — Inpatient Hospital Stay: Admit: 2021-04-06 | Payer: MEDICARE

## 2021-02-13 DIAGNOSIS — I429 Cardiomyopathy, unspecified: Secondary | ICD-10-CM

## 2021-02-13 LAB — CARDIAC DEVICE CHECK CHECK - REMOTE
Atrial Tachy Statistic AT/AF Burden Percent: 0 %
Atrial Tachy Statistic Date Time End: 20220627033523
Atrial Tachy Statistic Date Time Start: 20220609023229
Battery Date Time of Measurements: 20220627033521
Battery RRT Trigger: 2.727
Battery Remaining Longevity: 32 mo
Battery Voltage: 2.97 V
Brady Setting AT Mode Switch Rate: 171 {beats}/min
Brady Setting Lower Rate Limit: 50 {beats}/min
Brady Setting Maximum Sensor Rate: 120 {beats}/min
Brady Setting Maximum Tracking Rate: 120 {beats}/min
Brady Setting PAV Delay Low: 160 ms
Brady Setting SAV Delay Low: 130 ms
Brady Statistic AP VP Percent: 0.64 %
Brady Statistic AP VS Percent: 0.01 %
Brady Statistic AS VP Percent: 97.79 %
Brady Statistic AS VS Percent: 1.56 %
Brady Statistic Date Time End: 20220627033523
Brady Statistic Date Time Start: 20220609023229
Brady Statistic RA Percent Paced: 0.64 %
Brady Statistic RV Percent Paced: 0.85 %
CRT Statistic CRT Percent Paced: 97.97 %
CRT Statistic Date Time End: 20220627033523
CRT Statistic Date Time Start: 20220609023229
CRT Statistic LV Percent Paced: 97.97 %
Capacitor Charge Energy: 18 J
Capacitor Charge Time: 4.104
Capacitor Last Charge Date Time: 20220411041629
Date Time Interrogation Session: 20220627033523
Episode Date Time: 20220620192830
Episode Date Time: 20220620192913
Episode Date Time: 20220620193023
Episode Date Time: 20220620195107
Episode Date Time: 20220620200102
Episode Date Time: 20220622194502
Episode Date Time: 20220622194638
Episode Duration: 39 s
Episode Duration: 46 s
Episode Duration: 47 s
Episode Duration: 5 s
Episode Duration: 6 s
Episode Duration: 65 s
Episode Duration: 9 s
Episode Identifier: 544
Episode Identifier: 545
Episode Identifier: 546
Episode Identifier: 547
Episode Identifier: 548
Episode Identifier: 549
Episode Identifier: 550
Episode Statistic Recent Count: 0
Episode Statistic Recent Count: 0
Episode Statistic Recent Count: 0
Episode Statistic Recent Count: 0
Episode Statistic Recent Count: 0
Episode Statistic Recent Count: 0
Episode Statistic Recent Count: 0
Episode Statistic Recent Date Time End: 20220627033523
Episode Statistic Recent Date Time End: 20220627033523
Episode Statistic Recent Date Time End: 20220627033523
Episode Statistic Recent Date Time End: 20220627033523
Episode Statistic Recent Date Time End: 20220627033523
Episode Statistic Recent Date Time End: 20220627033523
Episode Statistic Recent Date Time End: 20220627033523
Episode Statistic Recent Date Time Start: 20220609023229
Episode Statistic Recent Date Time Start: 20220609023229
Episode Statistic Recent Date Time Start: 20220609023229
Episode Statistic Recent Date Time Start: 20220609023229
Episode Statistic Recent Date Time Start: 20220609023229
Episode Statistic Recent Date Time Start: 20220609023229
Episode Statistic Recent Date Time Start: 20220609023229
Episode Statistic Total Count: 0
Episode Statistic Total Count: 0
Episode Statistic Total Count: 0
Episode Statistic Total Count: 0
Episode Statistic Total Count: 0
Episode Statistic Total Count: 0
Episode Statistic Total Count: 0
Episode Statistic Total Date Time End: 20220627033523
Episode Statistic Total Date Time End: 20220627033523
Episode Statistic Total Date Time End: 20220627033523
Episode Statistic Total Date Time End: 20220627033523
Episode Statistic Total Date Time End: 20220627033523
Episode Statistic Total Date Time End: 20220627033523
Episode Statistic Total Date Time End: 20220627033523
Implantable Lead Implant Date: 20191119
Implantable Lead Implant Date: 20191119
Implantable Lead Implant Date: 20191119
Implantable Lead Model: 4298
Implantable Lead Model: 5076
Implantable Pulse Generator Implant Date: 20191119
Lead Channel Impedance Value: 232.653
Lead Channel Impedance Value: 257.018
Lead Channel Impedance Value: 260.571
Lead Channel Impedance Value: 262.946
Lead Channel Impedance Value: 266.667
Lead Channel Impedance Value: 418 Ohm
Lead Channel Impedance Value: 456 Ohm
Lead Channel Impedance Value: 456 Ohm
Lead Channel Impedance Value: 475 Ohm
Lead Channel Impedance Value: 532 Ohm
Lead Channel Impedance Value: 589 Ohm
Lead Channel Impedance Value: 608 Ohm
Lead Channel Impedance Value: 665 Ohm
Lead Channel Impedance Value: 817 Ohm
Lead Channel Impedance Value: 931 Ohm
Lead Channel Impedance Value: 950 Ohm
Lead Channel Impedance Value: 950 Ohm
Lead Channel Impedance Value: 950 Ohm
Lead Channel Pacing Threshold Amplitude: 0.5 V
Lead Channel Pacing Threshold Amplitude: 0.625 V
Lead Channel Pacing Threshold Amplitude: 0.875 V
Lead Channel Pacing Threshold Pulse Width: 0.4 ms
Lead Channel Pacing Threshold Pulse Width: 0.4 ms
Lead Channel Pacing Threshold Pulse Width: 0.4 ms
Lead Channel Sensing Intrinsic Amplitude: 18.625 mV
Lead Channel Sensing Intrinsic Amplitude: 18.625 mV
Lead Channel Sensing Intrinsic Amplitude: 2.25 mV
Lead Channel Sensing Intrinsic Amplitude: 2.25 mV
Lead Channel Setting Pacing Amplitude: 1.5 V
Lead Channel Setting Pacing Amplitude: 2 V
Lead Channel Setting Pacing Amplitude: 2.5 V
Lead Channel Setting Pacing Pulse Width: 0.4 ms
Lead Channel Setting Pacing Pulse Width: 0.4 ms
Lead Channel Setting Pacing Pulse Width: 0.4 ms
Lead Channel Setting Sensing Sensitivity: 0.3 mV
Lead Channel Setting Sensing Sensitivity: 0.45 mV
Therapy Statistic Recent ATP Delivered: 0
Therapy Statistic Recent Date Time End: 20220627033523
Therapy Statistic Recent Date Time Start: 20220609023229
Therapy Statistic Recent Shocks Aborted: 0
Therapy Statistic Recent Shocks Delivered: 0
Therapy Statistic Total  Date Time End: 20220627033523
Therapy Statistic Total ATP Delivered: 0
Therapy Statistic Total Shocks Aborted: 0
Therapy Statistic Total Shocks Delivered: 0
Zone Setting Detection Beats Denominator: 32 {beats}
Zone Setting Detection Beats Numerator: 24 {beats}
Zone Setting Detection Interval: 300 ms
Zone Setting Detection Interval: 350 ms
Zone Setting Detection Interval: 350 ms
Zone Setting Detection Interval: 360 ms

## 2021-03-10 ENCOUNTER — Encounter

## 2021-03-10 ENCOUNTER — Encounter (HOSPITAL_BASED_OUTPATIENT_CLINIC_OR_DEPARTMENT_OTHER): Admitting: Cardiovascular Disease

## 2021-04-06 ENCOUNTER — Other Ambulatory Visit (HOSPITAL_BASED_OUTPATIENT_CLINIC_OR_DEPARTMENT_OTHER): Admitting: Cardiovascular Disease

## 2021-04-21 ENCOUNTER — Encounter (HOSPITAL_BASED_OUTPATIENT_CLINIC_OR_DEPARTMENT_OTHER): Admitting: Cardiovascular Disease

## 2021-04-27 ENCOUNTER — Other Ambulatory Visit

## 2021-05-01 ENCOUNTER — Inpatient Hospital Stay: Admit: 2021-05-01 | Payer: MEDICARE

## 2021-05-01 ENCOUNTER — Ambulatory Visit: Admit: 2021-05-01 | Payer: MEDICARE | Attending: Cardiovascular Disease

## 2021-05-01 ENCOUNTER — Other Ambulatory Visit

## 2021-05-01 ENCOUNTER — Encounter (HOSPITAL_BASED_OUTPATIENT_CLINIC_OR_DEPARTMENT_OTHER): Admitting: Cardiovascular Disease

## 2021-05-01 VITALS — BP 118/71 | HR 53 | Ht 61.0 in | Wt 141.0 lb

## 2021-05-01 DIAGNOSIS — I420 Dilated cardiomyopathy: Secondary | ICD-10-CM

## 2021-05-01 LAB — CARDIAC DEVICE CHECK - IN CLINIC
Atrial Tachy Statistic AT/AF Burden Percent: 0 %
Atrial Tachy Statistic Date Time End: 20220912204633
Atrial Tachy Statistic Date Time Start: 20220703191553
Battery Date Time of Measurements: 20220912204749
Battery RRT Trigger: 2.727
Battery Remaining Longevity: 36 mo
Battery Voltage: 2.96 V
Brady Setting AT Mode Switch Rate: 171 {beats}/min
Brady Setting Lower Rate Limit: 50 {beats}/min
Brady Setting Maximum Sensor Rate: 120 {beats}/min
Brady Setting Maximum Tracking Rate: 120 {beats}/min
Brady Setting PAV Delay Low: 150 ms
Brady Setting SAV Delay Low: 110 ms
Brady Statistic AP VP Percent: 0.84 %
Brady Statistic AP VS Percent: 0.02 %
Brady Statistic AS VP Percent: 97.53 %
Brady Statistic AS VS Percent: 1.61 %
Brady Statistic Date Time End: 20220912204633
Brady Statistic Date Time Start: 20220703191553
Brady Statistic RA Percent Paced: 0.86 %
Brady Statistic RV Percent Paced: 2.51 %
CRT Statistic CRT Percent Paced: 98.03 %
CRT Statistic Date Time End: 20220912204633
CRT Statistic Date Time Start: 20220703191553
CRT Statistic LV Percent Paced: 98.03 %
Capacitor Charge Energy: 18 J
Capacitor Charge Time: 4.124
Capacitor Last Charge Date Time: 20220712070225
Date Time Interrogation Session: 20220912204633
Episode Date Time: 20220822214233
Episode Date Time: 20220822214251
Episode Date Time: 20220822214331
Episode Date Time: 20220822214552
Episode Date Time: 20220825120943
Episode Date Time: 20220825123437
Episode Date Time: 20220827041039
Episode Duration: 12 s
Episode Duration: 13 s
Episode Duration: 14 s
Episode Duration: 25 s
Episode Duration: 4 s
Episode Duration: 6 s
Episode Duration: 8 s
Episode Identifier: 733
Episode Identifier: 734
Episode Identifier: 735
Episode Identifier: 736
Episode Identifier: 737
Episode Identifier: 738
Episode Identifier: 739
Episode Statistic Recent Count: 0
Episode Statistic Recent Count: 0
Episode Statistic Recent Count: 0
Episode Statistic Recent Count: 0
Episode Statistic Recent Count: 0
Episode Statistic Recent Count: 0
Episode Statistic Recent Count: 0
Episode Statistic Recent Date Time End: 20220912204633
Episode Statistic Recent Date Time End: 20220912204633
Episode Statistic Recent Date Time End: 20220912204633
Episode Statistic Recent Date Time End: 20220912204633
Episode Statistic Recent Date Time End: 20220912204633
Episode Statistic Recent Date Time End: 20220912204633
Episode Statistic Recent Date Time End: 20220912204633
Episode Statistic Recent Date Time Start: 20220703191553
Episode Statistic Recent Date Time Start: 20220703191553
Episode Statistic Recent Date Time Start: 20220703191553
Episode Statistic Recent Date Time Start: 20220703191553
Episode Statistic Recent Date Time Start: 20220703191553
Episode Statistic Recent Date Time Start: 20220703191553
Episode Statistic Recent Date Time Start: 20220703191553
Episode Statistic Total Count: 0
Episode Statistic Total Count: 0
Episode Statistic Total Count: 0
Episode Statistic Total Count: 0
Episode Statistic Total Count: 0
Episode Statistic Total Count: 0
Episode Statistic Total Count: 0
Episode Statistic Total Date Time End: 20220912204633
Episode Statistic Total Date Time End: 20220912204633
Episode Statistic Total Date Time End: 20220912204633
Episode Statistic Total Date Time End: 20220912204633
Episode Statistic Total Date Time End: 20220912204633
Episode Statistic Total Date Time End: 20220912204633
Episode Statistic Total Date Time End: 20220912204633
Implantable Lead Implant Date: 20191119
Implantable Lead Implant Date: 20191119
Implantable Lead Implant Date: 20191119
Implantable Lead Model: 4298
Implantable Lead Model: 5076
Implantable Pulse Generator Implant Date: 20191119
Lead Channel Impedance Value: 1007 Ohm
Lead Channel Impedance Value: 250.943
Lead Channel Impedance Value: 255.093
Lead Channel Impedance Value: 266.667
Lead Channel Impedance Value: 270.667
Lead Channel Impedance Value: 283.733
Lead Channel Impedance Value: 418 Ohm
Lead Channel Impedance Value: 456 Ohm
Lead Channel Impedance Value: 475 Ohm
Lead Channel Impedance Value: 532 Ohm
Lead Channel Impedance Value: 532 Ohm
Lead Channel Impedance Value: 551 Ohm
Lead Channel Impedance Value: 608 Ohm
Lead Channel Impedance Value: 665 Ohm
Lead Channel Impedance Value: 874 Ohm
Lead Channel Impedance Value: 893 Ohm
Lead Channel Impedance Value: 950 Ohm
Lead Channel Impedance Value: 988 Ohm
Lead Channel Pacing Threshold Amplitude: 0.5 V
Lead Channel Pacing Threshold Amplitude: 0.5 V
Lead Channel Pacing Threshold Amplitude: 1.75 V
Lead Channel Pacing Threshold Pulse Width: 0.4 ms
Lead Channel Pacing Threshold Pulse Width: 0.4 ms
Lead Channel Pacing Threshold Pulse Width: 0.7 ms
Lead Channel Sensing Intrinsic Amplitude: 16.75 mV
Lead Channel Sensing Intrinsic Amplitude: 2.5 mV
Lead Channel Sensing Intrinsic Amplitude: 2.5 mV
Lead Channel Sensing Intrinsic Amplitude: 25.375 mV
Lead Channel Setting Pacing Amplitude: 1.5 V
Lead Channel Setting Pacing Amplitude: 2 V
Lead Channel Setting Pacing Amplitude: 2.5 V
Lead Channel Setting Pacing Pulse Width: 0.4 ms
Lead Channel Setting Pacing Pulse Width: 0.4 ms
Lead Channel Setting Pacing Pulse Width: 0.7 ms
Lead Channel Setting Sensing Sensitivity: 0.3 mV
Lead Channel Setting Sensing Sensitivity: 0.45 mV
Therapy Statistic Recent ATP Delivered: 0
Therapy Statistic Recent Date Time End: 20220912204633
Therapy Statistic Recent Date Time Start: 20220703191553
Therapy Statistic Recent Shocks Aborted: 0
Therapy Statistic Recent Shocks Delivered: 0
Therapy Statistic Total  Date Time End: 20220912204633
Therapy Statistic Total ATP Delivered: 0
Therapy Statistic Total Shocks Aborted: 0
Therapy Statistic Total Shocks Delivered: 0
Zone Setting Detection Beats Denominator: 32 {beats}
Zone Setting Detection Beats Numerator: 24 {beats}
Zone Setting Detection Interval: 1200 ms
Zone Setting Detection Interval: 300 ms
Zone Setting Detection Interval: 350 ms
Zone Setting Detection Interval: 350 ms
Zone Setting Detection Interval: 360 ms

## 2021-05-01 NOTE — Progress Notes (Signed)
 Reason for Visit: Non ischemic cardiomyopathy    HPI: We had the pleasure of seeing Stephanie [name] in the [facility] on Monday, May 01, 2021 in the company of her husband. As you recall, Stephanie [name] is a 70 year old female patient who has had a long-standing history of exertional dyspnea and chest discomfort. She splits her time between her home in [address], [place] and here in the [place] where her son lives in [place] and her daughter in [place].  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 Cardiac catheterization in 2008 failed to demonstrate any occlusive disease. At some point she was diagnosed with mitral regurgitation and was in fact referred for mitral valve repair/replacement before the cardiothoracic surgeon determined that her mitral regurgitation was functional due to her underlying cardiomyopathy. She was started on atenolol and enalapril and she continued following with her cardiologist periodically. In 2017 she had an LVEF 35-40% with a mildly dilated left ventricle and mild mitral regurgitation.  She was referred for consideration of cardiac resynchronization therapy in July 2019 after an echocardiogram showed an LVEF of 35%.  She underwent uneventful CRT-D implant on 07/08/2018.  In October 2020 her echocardiogram showed an LVEF of 45 to 50%. She has been doing quite well since then. Normal dual CRT ICD function, no new atrial arrhythmias, no new ventricular arrhythmia detections. Surprisingly, few months ago she developed diaphragmatic pacing.  She was evaluated at a local hospital in [place] where adjustments were made to the pacing vector and output.  There was a subsequent brief recurrence but none since then.  Today, interrogation of his her CRT-D showed 98% biventricular pacing.  Today LV capture management was turned off and the amplitude was set at 2.5 V@0 .4 ms.  Pulse width was also  increased 2.7 ms had a capture threshold of 1.75 V to minimize any diaphragmatic pacing.  The chest x-ray earlier today failed to demonstrate any migration of her LV lead. Her most recent echocardiogram in December 2021 showed an LVEF 45 to 50%.      Past Medical History:   Diagnosis Date   . Gastritis    . Graves disease 2010    s/p radioactive ablation   . Hyperlipidemia    . Hypertension    . LBBB (left bundle branch block) 06/2018    now s/p CRT-D   . Mild mitral regurgitation    . Nonischemic cardiomyopathy (CMS/HCC)     chronic HFrEf   . Osteoarthritis         Past Surgical History:   Procedure Laterality Date   . BREAST LUMPECTOMY       Medications:   Current Outpatient Medications   Medication Instructions   . aspirin 81 mg, oral, Daily RT   . atorvastatin (LIPITOR) 10 mg, oral, Daily RT   . bisoprolol (ZEBETA) 5 mg, oral, Daily RT   . furosemide (LASIX) 20 mg, oral, Daily RT   . levothyroxine (SYNTHROID) 100 mcg, oral, Daily RT   . omeprazole (PRILOSEC) 10 mg, oral, Daily RT   . sacubitriL-valsartan (Entresto) 97-103 mg tablet 1 tablet, oral       Allergies:  Patient has no known allergies.      reports that she has never smoked. She has never used smokeless tobacco. She reports that she does not drink alcohol.     Family History   Problem Relation Name Age of Onset   .  Coronary artery disease Mother     . Heart disease Father     . No Known Problems Daughter     . No Known Problems Son     . No Known Problems Son         ROS  Pertinent positives listed in HPI. Comprehensive ROS otherwise negative.    Physical Exam:     Gen: No acute distress, comfortable, alert  Lungs: Clear to auscultation bilaterally  CV: regular rate and rhythm, no murmurs, rubs, or gallops  Ext: no LE edema    Visit Vitals  BP 118/71 (BP Location: Left arm, Patient Position: Sitting)   Pulse 53   Ht 1.549 m   Wt 64 kg   SpO2 99%   BMI 26.64 kg/m   BSA 1.66 m       Echocardiogram: (08/10/2020): Normal-sized LV cavity with normal wall  thickness and mildly reduced LV systolic function, LVEF 45 to 21%.    CXR: (05/01/21): No acute abnormality    DEVICE: (05/01/21): Normal dual CRT ICD function, no new atrial arrhythmias, no new ventricular arrhythmia detections .   1% atrial pacing, 98 % biventricular pacing, good heart rate distribution. the pt had recent diaphragm stimulation in [place] over the summer requiring medical attention.  Only changes noted were LV capture management was turned off and amplitude set to 2.5V@0 .4ms. The paced AV delay was changed from to .  Today CRT pacing is effective less than 90% of the time.  Last visit in December it was effective 98% of the time.  Today's LV capture threshold was 2.5V@0 .4ms.  The pulse width was increased to 0.48ms with a capture threshold of 1.75V. Since her trip, she has not have recurrence of diaphragm stimulation. Battery longevity is 3 years.  home monitoring will continue.  To be seen in the Arrhythmia clinic today.    Assessment and Plan:  Problem List Items Addressed This Visit        Circulatory    Non-ischemic cardiomyopathy (CMS/HCC)    Current Assessment & Plan     The combination of optimal medical management and CRT-D optimized her LV systolic function to "near normal" placing her in the HFrecEF category.  She currently enjoys NYHA class I symptoms.  The issue with the diaphragmatic pacing is vexing given that it occurred so far out from the time of the implant and with no radiographic evidence of LV lead migration.  It is quite possible that the auto capture feature may have had something to do with her diaphragmatic pacing and this has not been rectified.  Fortunately, for the next few months she will be staying with her son in [place] and should this problem recur, she would not be too far away from [place].         Relevant Medications    bisoprolol (Zebeta) 2.5 MG split tablet    sacubitriL-valsartan (Entresto) 97-103 mg tablet      Other Visit Diagnoses     Dilated  cardiomyopathy (CMS/HCC)    -  Primary    Relevant Medications    bisoprolol (Zebeta) 2.5 MG split tablet    sacubitriL-valsartan (Entresto) 97-103 mg tablet    Other Relevant Orders    ECG 12 lead [facility] Performed)    XR CHEST 2 VIEWS (Completed)           Medication changes made this visit: NONE     Next recommended follow-up Visit:      On the  day of the encounter I spent 60 minutes on the total patient encounter, including time reviewing tests and/or medical records, taking a history, performing a medically appropriate examination and/or evaluation, counseling and educating the patient/family/caregiver, completing clinical documentation in the electronic or other health record, independently interpreting test results and communicating results to the patient/family/caregiver and care coordination including communication with referring physician(s).     Guerry Minors, MD

## 2021-05-07 NOTE — Assessment & Plan Note (Signed)
 The combination of optimal medical management and CRT-D optimized her LV systolic function to "near normal" placing her in the HFrecEF category.  She currently enjoys NYHA class I symptoms.  The issue with the diaphragmatic pacing is vexing given that it occurred so far out from the time of the implant and with no radiographic evidence of LV lead migration.  It is quite possible that the auto capture feature may have had something to do with her diaphragmatic pacing and this has not been rectified.  Fortunately, for the next few months she will be staying with her son in [place] and should this problem recur, she would not be too far away from [place].

## 2021-06-05 ENCOUNTER — Encounter

## 2021-06-27 ENCOUNTER — Other Ambulatory Visit (HOSPITAL_BASED_OUTPATIENT_CLINIC_OR_DEPARTMENT_OTHER)

## 2021-06-27 ENCOUNTER — Encounter

## 2021-06-27 MED ORDER — sacubitriL-valsartan (Entresto) 97-103 mg tablet
97-103 | ORAL_TABLET | Freq: Two times a day (BID) | ORAL | 11 refills | Status: DC
Start: 2021-06-27 — End: 2021-08-31

## 2021-08-07 ENCOUNTER — Inpatient Hospital Stay: Admit: 2021-09-01 | Payer: MEDICARE

## 2021-08-07 DIAGNOSIS — I429 Cardiomyopathy, unspecified: Secondary | ICD-10-CM

## 2021-08-28 LAB — HEMOGLOBIN A1C
Estimated Average Glucose mg/dL (INT/EXT): 120 mg/dL (ref 68–123)
HEMOGLOBIN A1C % (INT/EXT): 5.8 % — ABNORMAL HIGH (ref <=5.7)

## 2021-08-31 ENCOUNTER — Encounter

## 2021-08-31 ENCOUNTER — Other Ambulatory Visit (HOSPITAL_BASED_OUTPATIENT_CLINIC_OR_DEPARTMENT_OTHER)

## 2021-08-31 MED ORDER — sacubitriL-valsartan (Entresto) 97-103 mg tablet
97-103 | ORAL_TABLET | Freq: Two times a day (BID) | ORAL | 3 refills | Status: AC
Start: 2021-08-31 — End: 2022-08-31

## 2021-09-01 ENCOUNTER — Other Ambulatory Visit (HOSPITAL_BASED_OUTPATIENT_CLINIC_OR_DEPARTMENT_OTHER): Admitting: Cardiovascular Disease

## 2021-09-12 LAB — CARDIAC DEVICE CHECK CHECK - REMOTE
Atrial Tachy Statistic AT/AF Burden Percent: 0 %
Atrial Tachy Statistic Date Time End: 20221219074223
Atrial Tachy Statistic Date Time Start: 20220912204651
Battery Date Time of Measurements: 20221219074221
Battery RRT Trigger: 2.727
Battery Remaining Longevity: 30 mo
Battery Voltage: 2.95 V
Brady Setting AT Mode Switch Rate: 171 {beats}/min
Brady Setting Lower Rate Limit: 50 {beats}/min
Brady Setting Maximum Sensor Rate: 120 {beats}/min
Brady Setting Maximum Tracking Rate: 120 {beats}/min
Brady Setting PAV Delay Low: 150 ms
Brady Setting SAV Delay Low: 100 ms
Brady Statistic AP VP Percent: 0.88 %
Brady Statistic AP VS Percent: 0.02 %
Brady Statistic AS VP Percent: 97.45 %
Brady Statistic AS VS Percent: 1.64 %
Brady Statistic Date Time End: 20221219074223
Brady Statistic Date Time Start: 20220912204651
Brady Statistic RA Percent Paced: 0.91 %
Brady Statistic RV Percent Paced: 0.9 %
CRT Statistic CRT Percent Paced: 0.87 %
CRT Statistic Date Time End: 20221219074223
CRT Statistic Date Time Start: 20220912204651
CRT Statistic LV Percent Paced: 97.91 %
Capacitor Charge Energy: 18 J
Capacitor Charge Time: 4.154
Capacitor Last Charge Date Time: 20221012014645
Date Time Interrogation Session: 20221219074223
Episode Date Time: 20221207060418
Episode Date Time: 20221207060702
Episode Date Time: 20221207060725
Episode Date Time: 20221217161539
Episode Date Time: 20221217161816
Episode Date Time: 20221217161926
Episode Date Time: 20221217161936
Episode Date Time: 20221217162150
Episode Date Time: 20221217162449
Episode Date Time: 20221218032814
Episode Duration: 1 s
Episode Duration: 11 s
Episode Duration: 11 s
Episode Duration: 1295 s
Episode Duration: 14 s
Episode Duration: 4 s
Episode Duration: 5 s
Episode Duration: 6 s
Episode Duration: 6 s
Episode Duration: 7 s
Episode Identifier: 1
Episode Identifier: 2
Episode Identifier: 757
Episode Identifier: 798
Episode Identifier: 799
Episode Identifier: 800
Episode Identifier: 801
Episode Identifier: 802
Episode Identifier: 803
Episode Identifier: 804
Episode Statistic Recent Count: 0
Episode Statistic Recent Count: 0
Episode Statistic Recent Count: 0
Episode Statistic Recent Count: 0
Episode Statistic Recent Count: 0
Episode Statistic Recent Count: 0
Episode Statistic Recent Count: 1
Episode Statistic Recent Date Time End: 20221219074223
Episode Statistic Recent Date Time End: 20221219074223
Episode Statistic Recent Date Time End: 20221219074223
Episode Statistic Recent Date Time End: 20221219074223
Episode Statistic Recent Date Time End: 20221219074223
Episode Statistic Recent Date Time End: 20221219074223
Episode Statistic Recent Date Time End: 20221219074223
Episode Statistic Recent Date Time Start: 20220912204651
Episode Statistic Recent Date Time Start: 20220912204651
Episode Statistic Recent Date Time Start: 20220912204651
Episode Statistic Recent Date Time Start: 20220912204651
Episode Statistic Recent Date Time Start: 20220912204651
Episode Statistic Recent Date Time Start: 20220912204651
Episode Statistic Recent Date Time Start: 20220912204651
Episode Statistic Total Count: 0
Episode Statistic Total Count: 0
Episode Statistic Total Count: 0
Episode Statistic Total Count: 0
Episode Statistic Total Count: 0
Episode Statistic Total Count: 0
Episode Statistic Total Count: 1
Episode Statistic Total Date Time End: 20221219074223
Episode Statistic Total Date Time End: 20221219074223
Episode Statistic Total Date Time End: 20221219074223
Episode Statistic Total Date Time End: 20221219074223
Episode Statistic Total Date Time End: 20221219074223
Episode Statistic Total Date Time End: 20221219074223
Episode Statistic Total Date Time End: 20221219074223
Implantable Lead Implant Date: 20191119
Implantable Lead Implant Date: 20191119
Implantable Lead Implant Date: 20191119
Implantable Lead Model: 4298
Implantable Lead Model: 5076
Implantable Pulse Generator Implant Date: 20191119
Lead Channel Impedance Value: 1007 Ohm
Lead Channel Impedance Value: 1007 Ohm
Lead Channel Impedance Value: 1121 Ohm
Lead Channel Impedance Value: 1178 Ohm
Lead Channel Impedance Value: 261.164
Lead Channel Impedance Value: 289.597
Lead Channel Impedance Value: 295.556
Lead Channel Impedance Value: 299.908
Lead Channel Impedance Value: 306.303
Lead Channel Impedance Value: 361 Ohm
Lead Channel Impedance Value: 475 Ohm
Lead Channel Impedance Value: 513 Ohm
Lead Channel Impedance Value: 532 Ohm
Lead Channel Impedance Value: 532 Ohm
Lead Channel Impedance Value: 665 Ohm
Lead Channel Impedance Value: 722 Ohm
Lead Channel Impedance Value: 836 Ohm
Lead Channel Impedance Value: 893 Ohm
Lead Channel Pacing Threshold Amplitude: 0.5 V
Lead Channel Pacing Threshold Amplitude: 0.5 V
Lead Channel Pacing Threshold Amplitude: 0.875 V
Lead Channel Pacing Threshold Pulse Width: 0.4 ms
Lead Channel Pacing Threshold Pulse Width: 0.4 ms
Lead Channel Pacing Threshold Pulse Width: 0.4 ms
Lead Channel Sensing Intrinsic Amplitude: 2.5 mV
Lead Channel Sensing Intrinsic Amplitude: 2.5 mV
Lead Channel Sensing Intrinsic Amplitude: 20.125 mV
Lead Channel Sensing Intrinsic Amplitude: 20.125 mV
Lead Channel Setting Pacing Amplitude: 1.5 V
Lead Channel Setting Pacing Amplitude: 2 V
Lead Channel Setting Pacing Amplitude: 2.5 V
Lead Channel Setting Pacing Pulse Width: 0.4 ms
Lead Channel Setting Pacing Pulse Width: 0.4 ms
Lead Channel Setting Pacing Pulse Width: 0.7 ms
Lead Channel Setting Sensing Sensitivity: 0.3 mV
Lead Channel Setting Sensing Sensitivity: 0.45 mV
Therapy Statistic Recent ATP Delivered: 0
Therapy Statistic Recent Date Time End: 20221219074223
Therapy Statistic Recent Date Time Start: 20220912204651
Therapy Statistic Recent Shocks Aborted: 0
Therapy Statistic Recent Shocks Delivered: 0
Therapy Statistic Total  Date Time End: 20221219074223
Therapy Statistic Total ATP Delivered: 0
Therapy Statistic Total Shocks Aborted: 0
Therapy Statistic Total Shocks Delivered: 0
Zone Setting Detection Beats Denominator: 32 {beats}
Zone Setting Detection Beats Numerator: 24 {beats}
Zone Setting Detection Interval: 300 ms
Zone Setting Detection Interval: 350 ms
Zone Setting Detection Interval: 350 ms
Zone Setting Detection Interval: 360 ms

## 2021-10-04 ENCOUNTER — Encounter (HOSPITAL_BASED_OUTPATIENT_CLINIC_OR_DEPARTMENT_OTHER)

## 2021-10-11 ENCOUNTER — Encounter

## 2021-10-11 ENCOUNTER — Ambulatory Visit: Payer: MEDICARE | Attending: Internal Medicine

## 2021-10-31 ENCOUNTER — Other Ambulatory Visit (HOSPITAL_BASED_OUTPATIENT_CLINIC_OR_DEPARTMENT_OTHER): Admitting: Internal Medicine

## 2021-10-31 NOTE — Progress Notes (Signed)
Echo order entered for Sept 2023 appt

## 2021-11-06 ENCOUNTER — Inpatient Hospital Stay: Admit: 2021-11-13 | Payer: MEDICARE

## 2021-11-06 DIAGNOSIS — I429 Cardiomyopathy, unspecified: Secondary | ICD-10-CM

## 2021-11-13 ENCOUNTER — Other Ambulatory Visit (HOSPITAL_BASED_OUTPATIENT_CLINIC_OR_DEPARTMENT_OTHER): Admitting: Cardiovascular Disease

## 2021-11-13 LAB — CARDIAC DEVICE CHECK CHECK - REMOTE
Atrial Tachy Statistic AT/AF Burden Percent: 0 %
Atrial Tachy Statistic Date Time End: 20230320002206
Atrial Tachy Statistic Date Time Start: 20221219074223
Battery Date Time of Measurements: 20230320002204
Battery RRT Trigger: 2.727
Battery Remaining Longevity: 28 mo
Battery Voltage: 2.95 V
Brady Setting AT Mode Switch Rate: 171 {beats}/min
Brady Setting Lower Rate Limit: 50 {beats}/min
Brady Setting Maximum Sensor Rate: 120 {beats}/min
Brady Setting Maximum Tracking Rate: 120 {beats}/min
Brady Setting PAV Delay Low: 120 ms
Brady Setting SAV Delay Low: 130 ms
Brady Statistic AP VP Percent: 0.17 %
Brady Statistic AP VS Percent: 0 %
Brady Statistic AS VP Percent: 98.04 %
Brady Statistic AS VS Percent: 1.78 %
Brady Statistic Date Time End: 20230320002206
Brady Statistic Date Time Start: 20221219074223
Brady Statistic RA Percent Paced: 0.18 %
Brady Statistic RV Percent Paced: 2.84 %
CRT Statistic CRT Percent Paced: 2.81 %
CRT Statistic Date Time End: 20230320002206
CRT Statistic Date Time Start: 20221219074223
CRT Statistic LV Percent Paced: 97.69 %
Capacitor Charge Energy: 18 J
Capacitor Charge Time: 4.184
Capacitor Last Charge Date Time: 20230111203105
Date Time Interrogation Session: 20230320002206
Episode Date Time: 20230311193643
Episode Date Time: 20230311193713
Episode Date Time: 20230314022200
Episode Date Time: 20230314023219
Episode Date Time: 20230314023327
Episode Date Time: 20230314023350
Episode Date Time: 20230314024523
Episode Duration: 18 s
Episode Duration: 19 s
Episode Duration: 33 s
Episode Duration: 4 s
Episode Duration: 4 s
Episode Duration: 612 s
Episode Duration: 7 s
Episode Identifier: 950
Episode Identifier: 951
Episode Identifier: 952
Episode Identifier: 953
Episode Identifier: 954
Episode Identifier: 955
Episode Identifier: 956
Episode Statistic Recent Count: 0
Episode Statistic Recent Count: 0
Episode Statistic Recent Count: 0
Episode Statistic Recent Count: 0
Episode Statistic Recent Count: 0
Episode Statistic Recent Count: 0
Episode Statistic Recent Count: 0
Episode Statistic Recent Date Time End: 20230320002206
Episode Statistic Recent Date Time End: 20230320002206
Episode Statistic Recent Date Time End: 20230320002206
Episode Statistic Recent Date Time End: 20230320002206
Episode Statistic Recent Date Time End: 20230320002206
Episode Statistic Recent Date Time End: 20230320002206
Episode Statistic Recent Date Time End: 20230320002206
Episode Statistic Recent Date Time Start: 20221219074223
Episode Statistic Recent Date Time Start: 20221219074223
Episode Statistic Recent Date Time Start: 20221219074223
Episode Statistic Recent Date Time Start: 20221219074223
Episode Statistic Recent Date Time Start: 20221219074223
Episode Statistic Recent Date Time Start: 20221219074223
Episode Statistic Recent Date Time Start: 20221219074223
Episode Statistic Total Count: 0
Episode Statistic Total Count: 0
Episode Statistic Total Count: 0
Episode Statistic Total Count: 0
Episode Statistic Total Count: 0
Episode Statistic Total Count: 0
Episode Statistic Total Count: 1
Episode Statistic Total Date Time End: 20230320002206
Episode Statistic Total Date Time End: 20230320002206
Episode Statistic Total Date Time End: 20230320002206
Episode Statistic Total Date Time End: 20230320002206
Episode Statistic Total Date Time End: 20230320002206
Episode Statistic Total Date Time End: 20230320002206
Episode Statistic Total Date Time End: 20230320002206
Implantable Lead Implant Date: 20191119
Implantable Lead Implant Date: 20191119
Implantable Lead Implant Date: 20191119
Implantable Lead Model: 4298
Implantable Lead Model: 5076
Implantable Pulse Generator Implant Date: 20191119
Lead Channel Impedance Value: 1026 Ohm
Lead Channel Impedance Value: 1083 Ohm
Lead Channel Impedance Value: 1121 Ohm
Lead Channel Impedance Value: 1197 Ohm
Lead Channel Impedance Value: 279.525
Lead Channel Impedance Value: 295.556
Lead Channel Impedance Value: 312.348
Lead Channel Impedance Value: 312.941
Lead Channel Impedance Value: 331.831
Lead Channel Impedance Value: 418 Ohm
Lead Channel Impedance Value: 513 Ohm
Lead Channel Impedance Value: 532 Ohm
Lead Channel Impedance Value: 589 Ohm
Lead Channel Impedance Value: 608 Ohm
Lead Channel Impedance Value: 665 Ohm
Lead Channel Impedance Value: 760 Ohm
Lead Channel Impedance Value: 817 Ohm
Lead Channel Impedance Value: 988 Ohm
Lead Channel Pacing Threshold Amplitude: 0.5 V
Lead Channel Pacing Threshold Amplitude: 0.5 V
Lead Channel Pacing Threshold Amplitude: 0.875 V
Lead Channel Pacing Threshold Pulse Width: 0.4 ms
Lead Channel Pacing Threshold Pulse Width: 0.4 ms
Lead Channel Pacing Threshold Pulse Width: 0.4 ms
Lead Channel Sensing Intrinsic Amplitude: 17.375 mV
Lead Channel Sensing Intrinsic Amplitude: 17.375 mV
Lead Channel Sensing Intrinsic Amplitude: 2 mV
Lead Channel Sensing Intrinsic Amplitude: 2 mV
Lead Channel Setting Pacing Amplitude: 1.5 V
Lead Channel Setting Pacing Amplitude: 2 V
Lead Channel Setting Pacing Amplitude: 2.5 V
Lead Channel Setting Pacing Pulse Width: 0.4 ms
Lead Channel Setting Pacing Pulse Width: 0.4 ms
Lead Channel Setting Pacing Pulse Width: 0.7 ms
Lead Channel Setting Sensing Sensitivity: 0.3 mV
Lead Channel Setting Sensing Sensitivity: 0.45 mV
Therapy Statistic Recent ATP Delivered: 0
Therapy Statistic Recent Date Time End: 20230320002206
Therapy Statistic Recent Date Time Start: 20221219074223
Therapy Statistic Recent Shocks Aborted: 0
Therapy Statistic Recent Shocks Delivered: 0
Therapy Statistic Total  Date Time End: 20230320002206
Therapy Statistic Total ATP Delivered: 0
Therapy Statistic Total Shocks Aborted: 0
Therapy Statistic Total Shocks Delivered: 0
Zone Setting Detection Beats Denominator: 32 {beats}
Zone Setting Detection Beats Numerator: 24 {beats}
Zone Setting Detection Interval: 300 ms
Zone Setting Detection Interval: 350 ms
Zone Setting Detection Interval: 350 ms
Zone Setting Detection Interval: 360 ms

## 2022-02-05 ENCOUNTER — Inpatient Hospital Stay: Admit: 2022-02-06 | Payer: MEDICARE | Primary: Internal Medicine

## 2022-02-05 DIAGNOSIS — I255 Ischemic cardiomyopathy: Secondary | ICD-10-CM

## 2022-02-12 LAB — CARDIAC DEVICE CHECK CHECK - REMOTE
Atrial Tachy Statistic AT/AF Burden Percent: 0 %
Atrial Tachy Statistic Date Time End: 20230619023322
Atrial Tachy Statistic Date Time Start: 20230320002206
Battery Date Time of Measurements: 20230619023320
Battery RRT Trigger: 2.727
Battery Remaining Longevity: 28 mo
Battery Voltage: 2.95 V
Brady Setting AT Mode Switch Rate: 171 {beats}/min
Brady Setting Lower Rate Limit: 50 {beats}/min
Brady Setting Maximum Sensor Rate: 120 {beats}/min
Brady Setting Maximum Tracking Rate: 120 {beats}/min
Brady Setting PAV Delay Low: 140 ms
Brady Setting SAV Delay Low: 130 ms
Brady Statistic AP VP Percent: 0.28 %
Brady Statistic AP VS Percent: 0.01 %
Brady Statistic AS VP Percent: 98.08 %
Brady Statistic AS VS Percent: 1.64 %
Brady Statistic Date Time End: 20230619023322
Brady Statistic Date Time Start: 20230320002206
Brady Statistic RA Percent Paced: 0.29 %
Brady Statistic RV Percent Paced: 1.34 %
CRT Statistic CRT Percent Paced: 1.31 %
CRT Statistic Date Time End: 20230619023322
CRT Statistic Date Time Start: 20230320002206
CRT Statistic LV Percent Paced: 97.86 %
Capacitor Charge Energy: 18 J
Capacitor Charge Time: 4.214
Capacitor Last Charge Date Time: 20230413151525
Date Time Interrogation Session: 20230619023322
Episode Date Time: 20230607122622
Episode Date Time: 20230610154828
Episode Date Time: 20230610154912
Episode Date Time: 20230610154929
Episode Date Time: 20230610155144
Episode Date Time: 20230610155319
Episode Date Time: 20230617225557
Episode Duration: 10 s
Episode Duration: 132 s
Episode Duration: 14 s
Episode Duration: 21 s
Episode Duration: 27 s
Episode Duration: 68 s
Episode Duration: 9 s
Episode Identifier: 1000
Episode Identifier: 1001
Episode Identifier: 1002
Episode Identifier: 1003
Episode Identifier: 1004
Episode Identifier: 1005
Episode Identifier: 1006
Episode Statistic Recent Count: 0
Episode Statistic Recent Count: 0
Episode Statistic Recent Count: 0
Episode Statistic Recent Count: 0
Episode Statistic Recent Count: 0
Episode Statistic Recent Count: 0
Episode Statistic Recent Count: 0
Episode Statistic Recent Date Time End: 20230619023322
Episode Statistic Recent Date Time End: 20230619023322
Episode Statistic Recent Date Time End: 20230619023322
Episode Statistic Recent Date Time End: 20230619023322
Episode Statistic Recent Date Time End: 20230619023322
Episode Statistic Recent Date Time End: 20230619023322
Episode Statistic Recent Date Time End: 20230619023322
Episode Statistic Recent Date Time Start: 20230320002206
Episode Statistic Recent Date Time Start: 20230320002206
Episode Statistic Recent Date Time Start: 20230320002206
Episode Statistic Recent Date Time Start: 20230320002206
Episode Statistic Recent Date Time Start: 20230320002206
Episode Statistic Recent Date Time Start: 20230320002206
Episode Statistic Recent Date Time Start: 20230320002206
Episode Statistic Total Count: 0
Episode Statistic Total Count: 0
Episode Statistic Total Count: 0
Episode Statistic Total Count: 0
Episode Statistic Total Count: 0
Episode Statistic Total Count: 0
Episode Statistic Total Count: 1
Episode Statistic Total Date Time End: 20230619023322
Episode Statistic Total Date Time End: 20230619023322
Episode Statistic Total Date Time End: 20230619023322
Episode Statistic Total Date Time End: 20230619023322
Episode Statistic Total Date Time End: 20230619023322
Episode Statistic Total Date Time End: 20230619023322
Episode Statistic Total Date Time End: 20230619023322
Implantable Lead Implant Date: 20191119
Implantable Lead Implant Date: 20191119
Implantable Lead Implant Date: 20191119
Implantable Lead Model: 4298
Implantable Lead Model: 5076
Implantable Pulse Generator Implant Date: 20191119
Lead Channel Impedance Value: 1083 Ohm
Lead Channel Impedance Value: 1178 Ohm
Lead Channel Impedance Value: 244.491
Lead Channel Impedance Value: 256.667
Lead Channel Impedance Value: 264.733
Lead Channel Impedance Value: 312.348
Lead Channel Impedance Value: 324.377
Lead Channel Impedance Value: 399 Ohm
Lead Channel Impedance Value: 418 Ohm
Lead Channel Impedance Value: 532 Ohm
Lead Channel Impedance Value: 589 Ohm
Lead Channel Impedance Value: 589 Ohm
Lead Channel Impedance Value: 665 Ohm
Lead Channel Impedance Value: 722 Ohm
Lead Channel Impedance Value: 817 Ohm
Lead Channel Impedance Value: 931 Ohm
Lead Channel Impedance Value: 931 Ohm
Lead Channel Impedance Value: 988 Ohm
Lead Channel Pacing Threshold Amplitude: 0.5 V
Lead Channel Pacing Threshold Amplitude: 0.625 V
Lead Channel Pacing Threshold Amplitude: 0.875 V
Lead Channel Pacing Threshold Pulse Width: 0.4 ms
Lead Channel Pacing Threshold Pulse Width: 0.4 ms
Lead Channel Pacing Threshold Pulse Width: 0.4 ms
Lead Channel Sensing Intrinsic Amplitude: 1.625 mV
Lead Channel Sensing Intrinsic Amplitude: 22.375 mV
Lead Channel Setting Pacing Amplitude: 1.5 V
Lead Channel Setting Pacing Amplitude: 2 V
Lead Channel Setting Pacing Amplitude: 2.5 V
Lead Channel Setting Pacing Pulse Width: 0.4 ms
Lead Channel Setting Pacing Pulse Width: 0.4 ms
Lead Channel Setting Pacing Pulse Width: 0.7 ms
Lead Channel Setting Sensing Sensitivity: 0.3 mV
Lead Channel Setting Sensing Sensitivity: 0.45 mV
Therapy Statistic Recent ATP Delivered: 0
Therapy Statistic Recent Date Time End: 20230619023322
Therapy Statistic Recent Date Time Start: 20230320002206
Therapy Statistic Recent Shocks Aborted: 0
Therapy Statistic Recent Shocks Delivered: 0
Therapy Statistic Total  Date Time End: 20230619023322
Therapy Statistic Total ATP Delivered: 0
Therapy Statistic Total Shocks Aborted: 0
Therapy Statistic Total Shocks Delivered: 0
Zone Setting Detection Beats Denominator: 32 {beats}
Zone Setting Detection Beats Numerator: 24 {beats}
Zone Setting Detection Interval: 1200 ms
Zone Setting Detection Interval: 300 ms
Zone Setting Detection Interval: 350 ms
Zone Setting Detection Interval: 350 ms
Zone Setting Detection Interval: 360 ms

## 2022-04-25 ENCOUNTER — Inpatient Hospital Stay: Admit: 2022-04-25 | Payer: MEDICARE

## 2022-04-25 ENCOUNTER — Encounter

## 2022-04-25 ENCOUNTER — Inpatient Hospital Stay: Admit: 2022-04-25 | Payer: MEDICARE | Primary: Internal Medicine

## 2022-04-25 ENCOUNTER — Ambulatory Visit: Admit: 2022-04-25 | Discharge: 2022-04-25 | Payer: MEDICARE | Attending: Internal Medicine

## 2022-04-25 DIAGNOSIS — I5022 Chronic systolic (congestive) heart failure: Secondary | ICD-10-CM

## 2022-04-25 DIAGNOSIS — I428 Other cardiomyopathies: Secondary | ICD-10-CM

## 2022-04-25 DIAGNOSIS — I255 Ischemic cardiomyopathy: Secondary | ICD-10-CM

## 2022-04-25 MED ORDER — spironolactone (Aldactone) 25 mg tablet
25 | ORAL_TABLET | Freq: Every day | ORAL | 3 refills | Status: AC
Start: 2022-04-25 — End: 2023-04-25

## 2022-04-25 NOTE — Progress Notes (Signed)
Heart Failure Clinic Note      Patient:  Stephanie [name]   DOB:  [birth year]   MRN:     [id number] Age:   71 y.o.   Encounter Date: 04/25/2022 Sex:    female     Reason for Visit: 1 yr Follow-up    Associated Providers: PCP: No primary care provider on file.    It was a pleasure to see Stephanie [name] in the Heart Failure Clinic at [facility].  [name] is a 71 y.o. year old female with a PMH of HTN, HLD and graves disease sp radioactive ablation who presents with SOB on exertion, presents today for continued management . Mrs [name]'s symptoms started in the early 47s. At that time she developed SOB on exertion. She was able to walk a block adn climb 20 steps before developing SOB. She denied orthopnea and PND. She also complained of "chest burning" that did not correlated with exertion. Given the SOB and the chest burning, in 2008 she had LHC that did not show evidence of coronary artery disease. She also underwent echocardiogram that showed mild to moderate mitral valve regurgitation and LVEDD at 5.6 cm. She was started on atenolol and enalapril and she continued following with her cardiologist periodically. In 2010 she was diagnosed with Graves disease and she received radioactive iodine ablation.  Her symptoms had been stable up to a year ago when she started having worsening SOB on exertion. She ended up having SOB on minimal exeartion but she never developed PND, orthopnea or LE edema. She underwent work up in her home country of [place], including a TEE that was read as severe mitral regurgitation, for which she was referred to a Careers adviser for [facility]. However on surgical review, the MR was recognized to be less severe and functional in nature. Her LV was more dilated compared to 2008 (6.1 cm) and her EF had dropped to 40%. The patient was started on bisoprolol, entresto and lasix about three months. In 2017 she had an LVEF 35-40% with a mildly dilated left ventricle and mild mitral regurgitation. Over the  course of the last 2 years, her medical therapy has been optimized. In 2017 she had an LVEF 35-40% with a mildly dilated left ventricle and mild mitral regurgitation. She was referred for consideration of cardiac resynchronization therapy in July 2019 after an echocardiogram showed an LVEF of 35%.  She underwent uneventful CRT-D implant on 07/08/2018.  In October 2020 her echocardiogram showed an LVEF of 45 to 50%. She has been doing quite well since then. Normal dual CRT ICD function, no new atrial arrhythmias, no new ventricular arrhythmia detections. Surprisingly, few months ago she developed diaphragmatic pacing.  She was evaluated at a local hospital in [place] where adjustments were made to the pacing vector and output.  There was a subsequent brief recurrence but none since then. On 2019 she underwent a MDT biventricular pacemaker with defibrillator.  In October 2020 her echocardiogram showed an LVEF of 45 to 50%. She has been doing quite well since then. Normal dual CRT ICD function, no new atrial arrhythmias, no new ventricular arrhythmia detections. Surprisingly, few months ago she developed diaphragmatic pacing.  She was evaluated at a local hospital in [place] where adjustments were made to the pacing vector and output.  There was a subsequent brief recurrence but none since then.    On 03/14/2016, she presents to our clinic accompanied by her son and her husband. She reports significant improvement  of her symptoms since initiation of bisoprolol, entresto and lasix. She continues having some SOB on exertion but it is not limiting and she denies PND and orthopnea. She does however report that she is not as active as she usedx to be and might be holding back on pushing herself due to the cardiac condition. She also denies weight changes, LE edema. She continues to have the same chest burning that she had back in 2008 when her LHC was normal. She denies history of MI or history of rheumatic fever. She  is over in the [place] visiting for the summer, and will be back again in the late fall.     On 05/27/2019, she was successfully uptitrated to max dose 97/103mg  bid Entresto. She has followed annually with the HF clinic with LVEF 35% in 2018 and 2019. Given the persistent LVEF 35% with NYHA class I-II symptoms and LBBB she was referred to Dr Malcolm Metro and ultimately underwent CRT-D implantation on 07/08/2018.   Mrs [name] was seen in clinic, 05/27/2019, and has been feeling very well since CRT implantation. She has had no interval hospitalizations or medical changes. She has been mostly in the [place], and staying with the family locally during the pandemic, has avoided any COVID-19 exposures and denies any symptoms of infection. She can walk unlimited on the flat and has no symptoms when climbing a flight of stairs. She specifically denies any dyspnea on exertion, orthopnea, LE edema, chest pain, palpitations or dizziness. She is still tolerating the max Entresto dose very well. She has mild orthostatic lightheadness upon first standing. BPs at home 120s/70s. There has been no ICD discharges and no complications at the CRT-D site. She underwent a repeat echo and device interrogation. She completed labs with her PCP 04/28/19 as below.      On 08/10/2020, Mrs [name] returns to clinic for routine follow-up and an echocardiogram. She has had no interval hospitalizations or medical changes. She has been mostly in the [place], and staying with the family locally during the pandemic, has avoided any COVID-19 sympoms. She can  walk unlimited on the flat and has no symptoms when climbing a flight of stairs. She has been watching her diet and trying to exercise more, due to a prior hemoglobin A1c in the prediabetes range, and is pleased to have lost about 12 pounds. She has not been on an SGLT2 inhibitor in the past. She specifically denies any dyspnea on exertion, orthopnea, LE edema, chest pain, palpitations or dizziness. She is still  tolerating the max Entresto dose very well without significant dizziness or lightheadness. There has been no ICD discharges and no complications at the CRT-D site. Has received three doses of Covid vaccination.    On 01/30/2022, SHe has been very well in the interim. Her husband has been undergoing cancer therapy and so she has been busy supporting him but she denies any exercise limitations. Can walk 25 mins comfortably. No dyspnea on exertion, orthopnea, palps or dizziness. She is tolerating the max Entresto dose very well. She also denies weight changes, LE edema. She continues to have the same chest burning that she had back in 2008 when her LHC was normal and had an EGD/colonscopy locally recently with confirmation of gastritis as the chest pain Etiology.    On 02/12/2018, She has been very well in the interim. She denies any symptoms and has not been hospitalized. She can walk 25 mins comfortably, but exertion beyond this is limited by fatigue. No dyspnea on  exertion, orthopnea, palpitations or dizziness. She is tolerating the max Entresto dose very well. She also denies weight changes, LE edema. Her repeat echocardiogram is stable compared to priors.    On 03/03/2018, She can walk 25 mins comfortably, but exertion beyond this is limited by fatigue. No dyspnea on exertion, orthopnea, palpitations or dizziness. She denies any history of presyncope or syncope. She is tolerating the max Entresto dose very well. She also denies weight changes, LE edema. Her most recent echocardiogram on February 12, 2018 showed a normal-sized left ventricular cavity with an LVEF 35% and mild mitral regurgitation. No significant change was found. When compared to its predecessor on January 30, 2017.    On 05/01/2021, her CRT-D showed 98% biventricular pacing.  LV capture management was turned off and the amplitude was set at 2.5 V@0 .4 ms.  Pulse width was also increased 2.7 ms had a capture threshold of 1.75 V to minimize any diaphragmatic  pacing.  The chest x-ray earlier failed to demonstrate any migration of her LV lead. Her most recent echocardiogram in December 2021 showed an LVEF 45 to 50%.    Today, 04/25/2022,  Ms. Castello reports feeling generally okay, although not at 100% capacity. She not had any hospitalizations. However, she has noticed a change in her ability to climb stairs - she is unable to ascend them as easily as before. When she does go up stairs, she experiences shortness of breath and needs to take breaks before continuing. She also mentions experiencing increased dyspnea on exertion and it takes her a few seconds to recover. While she does feel more tired than before, she is still able to keep up with her family. 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 She is actively exercising her muscles in preparation for the procedure. Despite experiencing some pain while walking, it tends to subside after engaging in certain activities. She can walk for approximately 20-25 minutes, but experiences dyspnea on exertion when walking uphill or climbing stairs. The patient denies any dizziness, lightheadedness, syncope, or recent hospitalizations. She does mention feeling dizzy only when she turns her head quickly. She had a CT scan done for blood in the urine, and her liver function and blood tests were conducted in [place]. Occasionally, she experiences bloating in her abdomen. Her weight has remained steady.    Medications:  Current Outpatient Medications   Medication Instructions   . aspirin 81 mg, oral, Daily RT   . atorvastatin (LIPITOR) 10 mg, oral, Daily RT   . bisoprolol (ZEBETA) 2.5 mg, oral, Daily RT   . furosemide (LASIX) 20 mg, oral, Daily RT   . levothyroxine (SYNTHROID) 100 mcg, oral, Daily RT   . omeprazole (PRILOSEC) 10 mg, oral, Daily RT   . sacubitriL-valsartan (Entresto) 97-103 mg tablet 1 tablet, oral, 2 times daily   . spironolactone  (ALDACTONE) 25 mg, oral, Daily     Allergies: No Known Allergies    Past Medical History:   Diagnosis Date   . Gastritis    . Graves disease 2010    s/p radioactive ablation   . Hyperlipidemia    . Hypertension    . LBBB (left bundle branch block) 06/2018    now s/p CRT-D   . Mild mitral regurgitation    . Nonischemic cardiomyopathy (CMS/HCC)     chronic HFrEf   . Osteoarthritis      Past Surgical History:   Procedure Laterality Date   .  BREAST LUMPECTOMY       Social History     Tobacco Use   . Smoking status: Never   . Smokeless tobacco: Never   Substance Use Topics   . Alcohol use: Never   . Drug use: Not on file     Family History   Problem Relation Name Age of Onset   . Coronary artery disease Mother     . Heart disease Father     . No Known Problems Daughter     . No Known Problems Son     . No Known Problems Son       Physical Exam:  appears well  JVP unelevated  hs S1+S2+1/6 PSM   lungs clear  abdomen soft and non-tender  no LE edema, warm     Review of Systems:  A 12-point review of systems was completed and negative unless otherwise reported in the HPI.    Visit Vitals  BP 132/82 (BP Location: Right arm, Patient Position: Sitting, BP Cuff Size: Adult)   Pulse 85   Wt 65.9 kg   SpO2 99%   BMI 27.44 kg/m   BSA 1.68 m       Weight trend:  Wt Readings from Last 3 Encounters:   04/25/22 65.9 kg   05/01/21 64 kg   08/10/20 62.3 kg     Cardiac Testing:  ECG 08/10/20: A-sensed sinus rhythm, BiV paced    ECG 2019: Sinus rhythm, LBBB      Echo 04/25/2022: The left ventricle is normal in size. There is normal left ventricular wall thickness. Ejection Fraction = 50-55%. Left ventricular systolic function is normal. A full diastolic function study was completed with echocardiographic findings of Grade I diastolic dysfunction. No  regional wall motion abnormalities noted. The interventricular septum is asynchronous. The right ventricle is grossly normal size. There is a device lead in the right ventricle. The right  ventricular systolic function is normal. The left atrium is moderately dilated. Right atrial size is normal. The inferior vena cava is normal size with > 50% respirophasic variation, consistent with normal right atrial pressure. There is no Color Doppler evidence for an atrial septal defect. There is mild mitral annular calcification. There is no mitral valve stenosis. There is mild mitral regurgitation. Tricuspid leaflets are thickened. There is no tricuspid stenosis. There is mild tricuspid regurgitation. Right ventricular systolic pressure is normal. The aortic valve is trileaflet. The aortic valve opens well. No hemodynamically significant valvular aortic stenosis. Mild aortic regurgitation. The pulmonic valve is not well visualized. There is no pulmonic valvular stenosis. Trace pulmonic valvular regurgitation. The aortic root is normal size. The ascending aorta is normal size. No Doppler or imaging evidence of an aortic coarctation. There is no pericardial effusion. Prominent epicardial fat pad.     Echo 08/10/2020:  The left ventricle is normal in size. There is normal left ventricular wall thickness. Left ventricular systolic function is mildly reduced. Ejection Fraction =  45-50%.  Global longitudinal strain is - 15%. The tissue Doppler diastolic parameters are indeterminate. There is mild global hypokinesis of the left ventricle. The interventricular septum is asynchronous.  The right ventricle is normal in size and function  . There is a device lead in the right ventricle. TAPSE is 1.8cm. The left atrium is mildly dilated. Right atrial size is normal. The inferior vena cava is normal size with > 50% respirophasic variation, consistent with normal right atrial pressure. There is no Color Doppler evidence for an atrial  septal defect. There is mild mitral annular calcification. The mitral valve leaflets appear mildly thickened,. There is no mitral valve  stenosis. There is mild mitral regurgitation. The  tricuspid valve is normal in structure. There is mild tricuspid regurgitation. Right ventricular systolic pressure is normal. The aortic valve is mildly thickened. The aortic valve is trileaflet. The aortic valve opens well. No hemodynamically significant valvular aortic stenosis. Mild aortic regurgitation. The pulmonic valve is not well seen, but is grossly normal. Trace pulmonic valvular regurgitation. The aortic root is normal size. The ascending aorta is normal size. There is no pericardial effusion. Prominent anterior fat pad.       Echo 03/14/16: The left ventricle is mildly dilated. There is no thrombus. There is normal left ventricular wall thickness. Left ventricular systolic function is moderately reduced.Ejection Fraction = 35-40%. The interventricular septum is asynchronous. Basal-mid septal akinesis. Mild mitral and aortic valve regurgitation.    08/10/20 CRT-D interrogation: Normal DDD CRT-D function, no changes made to settings. Histogram shows 0.3% atrial pacing, 98.3% bi- ventricular pacing, good heart rate distribution, no new atrial or ventricular arrhythmias since last visit in October 2020. LV capture threshold 2.75V at 0.60ms. Home remote monitoring will continue. The patient will return to clinic in 1 year.        05/27/19 CRT-D interrogation: Normal dual CRT ICD function, no new atrial arrhythmias, no new ventricular arrhythmia  detections . 27 % atrial pacing, 97 % biventricular pacing, good heart rate distribution. Battery longevity is 7 years, home monitoring will continue.      LHC 2008: without evidence of CAD per patient report      Blood Work:  No results found for: NA, K, CL, CO2, BUN, CREATININE, GLUCOSE, CALCIUM, MG, BNP, NT, HGBA1C, TSH, TROPONINI, TROPOT, FERRITIN, IRONSAT No results found for: BNP, NTPROBNP     Labs 02/19/22 ([place])  Glu 111 mg/dL  ALT: 23 U/L  AST: 26 U/L  BILIRUBIN: 0.4 mg/dL    SODIUM: 161 mmol/L  POTASSIUM: 4.8 mmol/L  BUN 17 mg/dL  Creatinine: 0.6  mg/dL    LDL: 096 mg/dL   Magnesium: 2.0 mEq/L  TSH: 2.7 mIU/L  CRP: 1.3 mg/L    Blood  Hemglobin: 14.1  Wbc: 7.6  A1C: 5.7    Assessment/Plan:    In summary, Nanci Lakatos is a 71 y.o. year old with a long-standing history of chronic HFrEF, NICM, LBBB and mild MR who is now s/p CRT-D in 06/2018. She is asymptomatic and her echocardiogram shows partial LVEF recovery s/p CRT from EF 35% to 45-50%, and even better today  at 50-55%. She has had no recent hospitalizations, but has developed NYHA 2-3 symptoms as of summer 2023 with incomplete GDMT optimization although max-dose Entresto.     Nonischemic cardiomyopathy  HFimpEF  Ms Persing has had a very favorable LVEF response to the uptitration of BB and ARNI and the deployment of CRT. Her LVEF is the best that it has been in her past 5-year history with [place] at 50-55% today. However she does sound to be somewhat more symptomatic, as outlined in the HPI, with NYHA 2-3 symptoms and mild congestive symptoms, although appears euvolemic on exam. I therefore counseled that we should step up her neurohormonal therapy with the addition of spironolactone 25 mg daily, which she will start today. I would also like her to be on an SGLT2i for improved long-term HF outcomes regardless of EF. However, she does not have prescription insurance, so we are submitting a [employer] and exercise psychologist for [employer] for her.     Update: Labs 05/04/22 [facility], after the addn of spironolactone 25 mg (still pending grant application for SGLT2i): Na 140, K 4.2, BUN 19, Cr 0.68, eGFR >90.     LBBB, now s/p CRT  Continues device interrogations with , no arrhythmias thus far.     Mitral regurgitation  Not clinically significant, no indication for an MR procedural intervention.     Pre-diabetes  SGLT2i addition would be preferable to lessen the long-term progression of insulin resistance.     Health Care Maintenance/Other:  PCP: No primary care provider on  file.  Referring/primary cardiologist: No ref. provider found   Vaccinations:    Immunization History   Administered Date(s) Administered   . Covid-19 Moderna vaccine bivalent (12y+) 06/12/2021   . Influenza Vaccine,quadrivalent,adjuvanted 05/05/2019   . Tdap 05/06/2019     Disposition/Follow up  Next clinic visit: 1 year   Next testing: Echo in 1-2 years     On the day of the encounter I spent 45 minutes on the total patient encounter, including time reviewing tests and/or medical records, taking a history, performing a medically appropriate examination and/or evaluation, counseling and educating the patient/family/caregiver, completing clinical documentation in the electronic or other health record, independently interpreting test results and communicating results to the patient/family/caregiver and care coordination including communication with referring providers(s). Thank you for referring SAKARA LEHTINEN to the [facility]. We hope our thoughts are in keeping with yours. Please do not hesitate to contact us if there are any questions, concerns, or clinical updates.    Scribed for Dr. Madelaine Etienne by Rudene Christians, MD 05/07/22, 10:06 AM

## 2022-04-25 NOTE — Patient Instructions (Signed)
Today we talked about optimizing your heart medications to hopefully improve your breathing with exertion.   We will:  Add spironolactone 25 mg once daily (sent to pharmacy)  Check labs (basic metabolic and proBNP) locally after 5-7 days  Hopefully also add in Brenton or Farixga if we can find a way to do so affordably  Also, I will message you to confirm the final echo read.   Please call us on [phone] with any questions or concerns! Some additional info about the Jardiance/Farxiga is printed below:    Sodium-Glucose Co-Transporter 2 (SGLT-2) Inhibitors    How they work: SGLT-2 inhibitors were initially developed as medications for type 2 diabetes and help control blood glucose by allowing excess glucose to flow in the urine. This group of medications is also now known to be a good treatment for heart failure and for chronic kidney disease, even in patients without type 2 diabetes. We do not know exactly how they work to improve heart and kidney disease.     Names: Jardiance (empagliflozin), Farxiga (dapagliflozin), Invokana (canagliflozin), Steglatro (ertugliflozin)     Formulation: taken by mouth once in the morning. Because these medications increase urine flow, you should drink plenty of non-caloric fluids during the day.    Caution: These medications cause high amounts of glucose in the urine which increases the risk of genital infections. It is important to maintain strict genital hygiene to prevent infections (see below). If you develop an irritation, discharge, itching, foul smell, or any concerns in the genital area, call your provider immediately.    For men: take an extra minute to be sure that the tip of your penis is dry, by dabbing the tip with a toilet tissue, before zipping. If you are uncircumcised, pull the foreskin back tightly before urination to prevent urine from collecting in this area. If you dribble throughout the day, you must take extra time to wipe the head of the penis and  foreskin with a wet wipe or soapy cloth at various times throughout the day to prevent a yeast infection from forming.     For women: wipe with toilet tissue after urinating and follow this by using a wet wipe (baby wipes work excellently, just do not throw them in the toilet or toilet will block) and wipe the entire area. If you dribble throughout the day, you will need to change your underwear often. It would be best to use a panty liner and change that every time you urinate.     Caution: You should stop taking the SGLT-2 inhibitor 3 days before any planned surgery.     Caution: If you become sick, start vomiting, have diarrhea, or cannot drink enough fluids, you should stop taking SGLT-2 inhibitors until your symptoms go away.     In very rare cases, SGLT-2 inhibitor can cause diabetic ketoacidosis (DKA), a condition of buildup of ketones in your body, which can be life-threatening. Signs of DKA include persistent nausea/vomiting, excessive fatigue, sleepiness, or excessive thirst.  If you have these symptoms, you can test your urine for ketones by using ketone strips (Ketostix), which can be found over the counter at your pharmacy. If your urine has "moderate" or "large" amounts of ketones, stop your SGLT-2 inhibitor call your provider immediately.    Go to the Emergency Department if you are:  Vomiting and cannot keep fluids down and/or feeling very unwell and cannot manage at home.

## 2022-04-26 ENCOUNTER — Inpatient Hospital Stay: Admit: 2022-04-26 | Payer: MEDICARE

## 2022-04-26 DIAGNOSIS — I429 Cardiomyopathy, unspecified: Secondary | ICD-10-CM

## 2022-05-14 MED ORDER — dapagliflozin propanediol (Farxiga) 10 mg
10 | ORAL_TABLET | Freq: Every day | ORAL | 3 refills | Status: AC
Start: 2022-05-14 — End: 2023-05-14

## 2022-05-18 LAB — CARDIAC DEVICE CHECK - IN CLINIC
Atrial Tachy Statistic AT/AF Burden Percent: 0 %
Atrial Tachy Statistic Date Time End: 20230906163231
Atrial Tachy Statistic Date Time Start: 20220912204651
Battery Date Time of Measurements: 20230906163411
Battery RRT Trigger: 2.727
Battery Remaining Longevity: 26 mo
Battery Voltage: 2.94 V
Brady Setting AT Mode Switch Rate: 171 {beats}/min
Brady Setting Lower Rate Limit: 50 {beats}/min
Brady Setting Maximum Sensor Rate: 120 {beats}/min
Brady Setting Maximum Tracking Rate: 120 {beats}/min
Brady Setting PAV Delay Low: 160 ms
Brady Setting SAV Delay Low: 120 ms
Brady Statistic AP VP Percent: 0.49 %
Brady Statistic AP VS Percent: 0.01 %
Brady Statistic AS VP Percent: 97.83 %
Brady Statistic AS VS Percent: 1.67 %
Brady Statistic Date Time End: 20230906163231
Brady Statistic Date Time Start: 20220912204651
Brady Statistic RA Percent Paced: 0.5 %
Brady Statistic RV Percent Paced: 1.51 %
CRT Statistic CRT Percent Paced: 97.84 %
CRT Statistic Date Time End: 20230906163231
CRT Statistic Date Time Start: 20220912204651
CRT Statistic LV Percent Paced: 97.84 %
Capacitor Charge Energy: 18 J
Capacitor Charge Time: 4.244
Capacitor Last Charge Date Time: 20230714095945
Date Time Interrogation Session: 20230906163231
Episode Date Time: 20230831113628
Episode Date Time: 20230831160439
Episode Date Time: 20230831160451
Episode Date Time: 20230831160535
Episode Date Time: 20230831160818
Episode Date Time: 20230831161112
Episode Date Time: 20230831161145
Episode Duration: 16 s
Episode Duration: 19 s
Episode Duration: 24 s
Episode Duration: 47 s
Episode Duration: 6 s
Episode Duration: 8 s
Episode Duration: 9 s
Episode Identifier: 1017
Episode Identifier: 1018
Episode Identifier: 1019
Episode Identifier: 1020
Episode Identifier: 1021
Episode Identifier: 1022
Episode Identifier: 1023
Episode Statistic Recent Count: 0
Episode Statistic Recent Count: 0
Episode Statistic Recent Count: 0
Episode Statistic Recent Count: 0
Episode Statistic Recent Count: 0
Episode Statistic Recent Count: 0
Episode Statistic Recent Count: 1
Episode Statistic Recent Date Time End: 20230906163231
Episode Statistic Recent Date Time End: 20230906163231
Episode Statistic Recent Date Time End: 20230906163231
Episode Statistic Recent Date Time End: 20230906163231
Episode Statistic Recent Date Time End: 20230906163231
Episode Statistic Recent Date Time End: 20230906163231
Episode Statistic Recent Date Time End: 20230906163231
Episode Statistic Recent Date Time Start: 20220912204651
Episode Statistic Recent Date Time Start: 20220912204651
Episode Statistic Recent Date Time Start: 20220912204651
Episode Statistic Recent Date Time Start: 20220912204651
Episode Statistic Recent Date Time Start: 20220912204651
Episode Statistic Recent Date Time Start: 20220912204651
Episode Statistic Recent Date Time Start: 20220912204651
Episode Statistic Total Count: 0
Episode Statistic Total Count: 0
Episode Statistic Total Count: 0
Episode Statistic Total Count: 0
Episode Statistic Total Count: 0
Episode Statistic Total Count: 0
Episode Statistic Total Count: 1
Episode Statistic Total Date Time End: 20230906163231
Episode Statistic Total Date Time End: 20230906163231
Episode Statistic Total Date Time End: 20230906163231
Episode Statistic Total Date Time End: 20230906163231
Episode Statistic Total Date Time End: 20230906163231
Episode Statistic Total Date Time End: 20230906163231
Episode Statistic Total Date Time End: 20230906163231
Implantable Lead Implant Date: 20191119
Implantable Lead Implant Date: 20191119
Implantable Lead Implant Date: 20191119
Implantable Lead Model: 4298
Implantable Lead Model: 5076
Implantable Pulse Generator Implant Date: 20191119
Lead Channel Impedance Value: 1083 Ohm
Lead Channel Impedance Value: 1178 Ohm
Lead Channel Impedance Value: 262.946
Lead Channel Impedance Value: 273.729
Lead Channel Impedance Value: 286.508
Lead Channel Impedance Value: 308.092
Lead Channel Impedance Value: 324.377
Lead Channel Impedance Value: 418 Ohm
Lead Channel Impedance Value: 475 Ohm
Lead Channel Impedance Value: 513 Ohm
Lead Channel Impedance Value: 589 Ohm
Lead Channel Impedance Value: 589 Ohm
Lead Channel Impedance Value: 646 Ohm
Lead Channel Impedance Value: 722 Ohm
Lead Channel Impedance Value: 817 Ohm
Lead Channel Impedance Value: 931 Ohm
Lead Channel Impedance Value: 931 Ohm
Lead Channel Impedance Value: 988 Ohm
Lead Channel Pacing Threshold Amplitude: 0.5 V
Lead Channel Pacing Threshold Amplitude: 0.625 V
Lead Channel Pacing Threshold Amplitude: 0.875 V
Lead Channel Pacing Threshold Pulse Width: 0.4 ms
Lead Channel Pacing Threshold Pulse Width: 0.4 ms
Lead Channel Pacing Threshold Pulse Width: 0.4 ms
Lead Channel Sensing Intrinsic Amplitude: 18.5 mV
Lead Channel Sensing Intrinsic Amplitude: 2.75 mV
Lead Channel Setting Pacing Amplitude: 1.5 V
Lead Channel Setting Pacing Amplitude: 2 V
Lead Channel Setting Pacing Amplitude: 4.25 V
Lead Channel Setting Pacing Pulse Width: 0.4 ms
Lead Channel Setting Pacing Pulse Width: 0.4 ms
Lead Channel Setting Pacing Pulse Width: 0.7 ms
Lead Channel Setting Sensing Sensitivity: 0.3 mV
Lead Channel Setting Sensing Sensitivity: 0.45 mV
Therapy Statistic Recent ATP Delivered: 0
Therapy Statistic Recent Date Time End: 20230906163231
Therapy Statistic Recent Date Time Start: 20220912204651
Therapy Statistic Recent Shocks Aborted: 0
Therapy Statistic Recent Shocks Delivered: 0
Therapy Statistic Total  Date Time End: 20230906163231
Therapy Statistic Total ATP Delivered: 0
Therapy Statistic Total Shocks Aborted: 0
Therapy Statistic Total Shocks Delivered: 0
Zone Setting Detection Beats Denominator: 32 {beats}
Zone Setting Detection Beats Numerator: 24 {beats}
Zone Setting Detection Interval: 1200 ms
Zone Setting Detection Interval: 300 ms
Zone Setting Detection Interval: 350 ms
Zone Setting Detection Interval: 350 ms
Zone Setting Detection Interval: 360 ms

## 2022-06-11 NOTE — Telephone Encounter (Signed)
From: Durwin Nora  To: Dr. Boris Lown  Sent: 06/08/2022 5:43 PM EDT  Subject: Foye Deer Dr Madelaine Etienne,    I apologize for the inconvenience, but the other program that approved me, ended up not being able to give me the medicine without another insurance, which I don't have. I have since been approved for [employer] rx assistance. Could you please fax them an RX for me for a 90 day supply with 3 refills? Their fax is [phone]. I appreciate your help.     Thank you,  Roxan Hockey

## 2022-06-12 MED ORDER — dapagliflozin propanediol (Farxiga) 10 mg
10 | ORAL_TABLET | Freq: Every day | ORAL | 3 refills | Status: AC
Start: 2022-06-12 — End: 2023-06-12

## 2022-07-30 ENCOUNTER — Inpatient Hospital Stay: Admit: 2022-08-10 | Payer: MEDICARE | Primary: Internal Medicine

## 2022-07-30 DIAGNOSIS — I429 Cardiomyopathy, unspecified: Secondary | ICD-10-CM

## 2022-07-31 LAB — CARDIAC DEVICE CHECK - IN CLINIC
Atrial Tachy Statistic AT/AF Burden Percent: 0 %
Atrial Tachy Statistic Date Time End: 20230907210929
Atrial Tachy Statistic Date Time Start: 20230906163310
Battery Date Time of Measurements: 20230907210924
Battery RRT Trigger: 2.727
Battery Remaining Longevity: 26 mo
Battery Voltage: 2.92 V
Brady Setting AT Mode Switch Rate: 171 {beats}/min
Brady Setting Lower Rate Limit: 50 {beats}/min
Brady Setting Maximum Sensor Rate: 120 {beats}/min
Brady Setting Maximum Tracking Rate: 120 {beats}/min
Brady Setting PAV Delay Low: 150 ms
Brady Setting SAV Delay Low: 100 ms
Brady Statistic AP VP Percent: 0.04 %
Brady Statistic AP VS Percent: 0.01 %
Brady Statistic AS VP Percent: 95.81 %
Brady Statistic AS VS Percent: 4.14 %
Brady Statistic Date Time End: 20230907210929
Brady Statistic Date Time Start: 20230906163310
Brady Statistic RA Percent Paced: 0.05 %
Brady Statistic RV Percent Paced: 10.08 %
CRT LV-RV Delay: 10 ms
CRT Statistic CRT Percent Paced: 95.16 %
CRT Statistic Date Time End: 20230907210929
CRT Statistic Date Time Start: 20230906163310
CRT Statistic LV Percent Paced: 95.16 %
Capacitor Charge Energy: 18 J
Capacitor Charge Time: 4.244
Capacitor Last Charge Date Time: 20230714095945
Date Time Interrogation Session: 20230907210929
Episode Date Time: 20230907005718
Episode Date Time: 20230907022116
Episode Date Time: 20230907022135
Episode Date Time: 20230907022643
Episode Date Time: 20230907023412
Episode Date Time: 20230907023530
Episode Date Time: 20230907024001
Episode Duration: 1257 s
Episode Duration: 14 s
Episode Duration: 283 s
Episode Duration: 35 s
Episode Duration: 8 s
Episode Duration: 8 s
Episode Duration: 84 s
Episode Identifier: 1030
Episode Identifier: 1031
Episode Identifier: 1032
Episode Identifier: 1033
Episode Identifier: 1034
Episode Identifier: 1035
Episode Identifier: 1036
Episode Statistic Recent Count: 0
Episode Statistic Recent Count: 0
Episode Statistic Recent Count: 0
Episode Statistic Recent Count: 0
Episode Statistic Recent Count: 0
Episode Statistic Recent Count: 0
Episode Statistic Recent Count: 0
Episode Statistic Recent Date Time End: 20230907210929
Episode Statistic Recent Date Time End: 20230907210929
Episode Statistic Recent Date Time End: 20230907210929
Episode Statistic Recent Date Time End: 20230907210929
Episode Statistic Recent Date Time End: 20230907210929
Episode Statistic Recent Date Time End: 20230907210929
Episode Statistic Recent Date Time End: 20230907210929
Episode Statistic Recent Date Time Start: 20230906163310
Episode Statistic Recent Date Time Start: 20230906163310
Episode Statistic Recent Date Time Start: 20230906163310
Episode Statistic Recent Date Time Start: 20230906163310
Episode Statistic Recent Date Time Start: 20230906163310
Episode Statistic Recent Date Time Start: 20230906163310
Episode Statistic Recent Date Time Start: 20230906163310
Episode Statistic Total Count: 0
Episode Statistic Total Count: 0
Episode Statistic Total Count: 0
Episode Statistic Total Count: 0
Episode Statistic Total Count: 0
Episode Statistic Total Count: 0
Episode Statistic Total Count: 1
Episode Statistic Total Date Time End: 20230907210929
Episode Statistic Total Date Time End: 20230907210929
Episode Statistic Total Date Time End: 20230907210929
Episode Statistic Total Date Time End: 20230907210929
Episode Statistic Total Date Time End: 20230907210929
Episode Statistic Total Date Time End: 20230907210929
Episode Statistic Total Date Time End: 20230907210929
Implantable Lead Implant Date: 20191119
Implantable Lead Implant Date: 20191119
Implantable Lead Implant Date: 20191119
Implantable Lead Model: 4298
Implantable Lead Model: 5076
Implantable Pulse Generator Implant Date: 20191119
Lead Channel Impedance Value: 1007 Ohm
Lead Channel Impedance Value: 1007 Ohm
Lead Channel Impedance Value: 1121 Ohm
Lead Channel Impedance Value: 1178 Ohm
Lead Channel Impedance Value: 274.19 Ohm
Lead Channel Impedance Value: 289.597
Lead Channel Impedance Value: 306.269
Lead Channel Impedance Value: 312.348
Lead Channel Impedance Value: 331.831
Lead Channel Impedance Value: 418 Ohm
Lead Channel Impedance Value: 513 Ohm
Lead Channel Impedance Value: 513 Ohm
Lead Channel Impedance Value: 589 Ohm
Lead Channel Impedance Value: 589 Ohm
Lead Channel Impedance Value: 665 Ohm
Lead Channel Impedance Value: 760 Ohm
Lead Channel Impedance Value: 874 Ohm
Lead Channel Impedance Value: 931 Ohm
Lead Channel Pacing Threshold Amplitude: 0.625 V
Lead Channel Pacing Threshold Amplitude: 0.625 V
Lead Channel Pacing Threshold Amplitude: 0.875 V
Lead Channel Pacing Threshold Pulse Width: 0.4 ms
Lead Channel Pacing Threshold Pulse Width: 0.4 ms
Lead Channel Pacing Threshold Pulse Width: 0.4 ms
Lead Channel Sensing Intrinsic Amplitude: 2 mV
Lead Channel Sensing Intrinsic Amplitude: 2 mV
Lead Channel Sensing Intrinsic Amplitude: 25.875 mV
Lead Channel Sensing Intrinsic Amplitude: 25.875 mV
Lead Channel Setting Pacing Amplitude: 1.5 V
Lead Channel Setting Pacing Amplitude: 2 V
Lead Channel Setting Pacing Amplitude: 2.5 V
Lead Channel Setting Pacing Pulse Width: 0.4 ms
Lead Channel Setting Pacing Pulse Width: 0.4 ms
Lead Channel Setting Pacing Pulse Width: 0.7 ms
Lead Channel Setting Sensing Sensitivity: 0.3 mV
Lead Channel Setting Sensing Sensitivity: 0.45 mV
Therapy Statistic Recent ATP Delivered: 0
Therapy Statistic Recent Date Time End: 20230907210929
Therapy Statistic Recent Date Time Start: 20230906163310
Therapy Statistic Recent Shocks Aborted: 0
Therapy Statistic Recent Shocks Delivered: 0
Therapy Statistic Total  Date Time End: 20230907210929
Therapy Statistic Total ATP Delivered: 0
Therapy Statistic Total Shocks Aborted: 0
Therapy Statistic Total Shocks Delivered: 0
Zone Setting Detection Beats Denominator: 32 {beats}
Zone Setting Detection Beats Numerator: 24 {beats}
Zone Setting Detection Interval: 300 ms
Zone Setting Detection Interval: 350 ms
Zone Setting Detection Interval: 350 ms
Zone Setting Detection Interval: 360 ms

## 2022-08-22 LAB — CARDIAC DEVICE CHECK CHECK - REMOTE
Atrial Tachy Statistic AT/AF Burden Percent: 0 %
Atrial Tachy Statistic Date Time End: 20231211063426
Atrial Tachy Statistic Date Time Start: 20230907211003
Battery Date Time of Measurements: 20231211063424
Battery RRT Trigger: 2.727
Battery Remaining Longevity: 24 mo
Battery Voltage: 2.94 V
Brady Setting AT Mode Switch Rate: 171 {beats}/min
Brady Setting Lower Rate Limit: 50 {beats}/min
Brady Setting Maximum Sensor Rate: 120 {beats}/min
Brady Setting Maximum Tracking Rate: 120 {beats}/min
Brady Setting PAV Delay Low: 140 ms
Brady Setting SAV Delay Low: 130 ms
Brady Statistic AP VP Percent: 0.15 %
Brady Statistic AP VS Percent: 0 %
Brady Statistic AS VP Percent: 98.37 %
Brady Statistic AS VS Percent: 1.48 %
Brady Statistic Date Time End: 20231211063426
Brady Statistic Date Time Start: 20230907211003
Brady Statistic RA Percent Paced: 0.15 %
Brady Statistic RV Percent Paced: 1.3 %
CRT Statistic CRT Percent Paced: 1.27 %
CRT Statistic Date Time End: 20231211063426
CRT Statistic Date Time Start: 20230907211003
CRT Statistic LV Percent Paced: 98.3 %
Capacitor Charge Energy: 18 J
Capacitor Charge Time: 4.254
Capacitor Last Charge Date Time: 20231014044406
Date Time Interrogation Session: 20231211063426
Episode Date Time: 20231206170206
Episode Date Time: 20231206170620
Episode Date Time: 20231206170655
Episode Date Time: 20231210171923
Episode Date Time: 20231210180741
Episode Date Time: 20231210181115
Episode Date Time: 20231210181132
Episode Duration: 13 s
Episode Duration: 22 s
Episode Duration: 25 s
Episode Duration: 28 s
Episode Duration: 7 s
Episode Duration: 7 s
Episode Duration: 72 s
Episode Identifier: 1047
Episode Identifier: 1048
Episode Identifier: 1049
Episode Identifier: 1050
Episode Identifier: 1051
Episode Identifier: 1052
Episode Identifier: 1053
Episode Statistic Recent Count: 0
Episode Statistic Recent Count: 0
Episode Statistic Recent Count: 0
Episode Statistic Recent Count: 0
Episode Statistic Recent Count: 0
Episode Statistic Recent Count: 0
Episode Statistic Recent Count: 0
Episode Statistic Recent Date Time End: 20231211063426
Episode Statistic Recent Date Time End: 20231211063426
Episode Statistic Recent Date Time End: 20231211063426
Episode Statistic Recent Date Time End: 20231211063426
Episode Statistic Recent Date Time End: 20231211063426
Episode Statistic Recent Date Time End: 20231211063426
Episode Statistic Recent Date Time End: 20231211063426
Episode Statistic Recent Date Time Start: 20230907211003
Episode Statistic Recent Date Time Start: 20230907211003
Episode Statistic Recent Date Time Start: 20230907211003
Episode Statistic Recent Date Time Start: 20230907211003
Episode Statistic Recent Date Time Start: 20230907211003
Episode Statistic Recent Date Time Start: 20230907211003
Episode Statistic Recent Date Time Start: 20230907211003
Episode Statistic Total Count: 0
Episode Statistic Total Count: 0
Episode Statistic Total Count: 0
Episode Statistic Total Count: 0
Episode Statistic Total Count: 0
Episode Statistic Total Count: 0
Episode Statistic Total Count: 1
Episode Statistic Total Date Time End: 20231211063426
Episode Statistic Total Date Time End: 20231211063426
Episode Statistic Total Date Time End: 20231211063426
Episode Statistic Total Date Time End: 20231211063426
Episode Statistic Total Date Time End: 20231211063426
Episode Statistic Total Date Time End: 20231211063426
Episode Statistic Total Date Time End: 20231211063426
Implantable Lead Implant Date: 20191119
Implantable Lead Implant Date: 20191119
Implantable Lead Implant Date: 20191119
Implantable Lead Model: 4298
Implantable Lead Model: 5076
Implantable Pulse Generator Implant Date: 20191119
Lead Channel Impedance Value: 1007 Ohm
Lead Channel Impedance Value: 1064 Ohm
Lead Channel Impedance Value: 1121 Ohm
Lead Channel Impedance Value: 1197 Ohm
Lead Channel Impedance Value: 1197 Ohm
Lead Channel Impedance Value: 283.733
Lead Channel Impedance Value: 306.303
Lead Channel Impedance Value: 312.941
Lead Channel Impedance Value: 330.057
Lead Channel Impedance Value: 337.778
Lead Channel Impedance Value: 456 Ohm
Lead Channel Impedance Value: 532 Ohm
Lead Channel Impedance Value: 551 Ohm
Lead Channel Impedance Value: 608 Ohm
Lead Channel Impedance Value: 646 Ohm
Lead Channel Impedance Value: 722 Ohm
Lead Channel Impedance Value: 760 Ohm
Lead Channel Impedance Value: 893 Ohm
Lead Channel Pacing Threshold Amplitude: 0.5 V
Lead Channel Pacing Threshold Amplitude: 0.5 V
Lead Channel Pacing Threshold Amplitude: 0.875 V
Lead Channel Pacing Threshold Pulse Width: 0.4 ms
Lead Channel Pacing Threshold Pulse Width: 0.4 ms
Lead Channel Pacing Threshold Pulse Width: 0.4 ms
Lead Channel Sensing Intrinsic Amplitude: 25.875 mV
Lead Channel Sensing Intrinsic Amplitude: 25.875 mV
Lead Channel Sensing Intrinsic Amplitude: 3.125 mV
Lead Channel Sensing Intrinsic Amplitude: 3.125 mV
Lead Channel Setting Pacing Amplitude: 1.5 V
Lead Channel Setting Pacing Amplitude: 2 V
Lead Channel Setting Pacing Amplitude: 2.5 V
Lead Channel Setting Pacing Pulse Width: 0.4 ms
Lead Channel Setting Pacing Pulse Width: 0.4 ms
Lead Channel Setting Pacing Pulse Width: 0.7 ms
Lead Channel Setting Sensing Sensitivity: 0.3 mV
Lead Channel Setting Sensing Sensitivity: 0.45 mV
Therapy Statistic Recent ATP Delivered: 0
Therapy Statistic Recent Date Time End: 20231211063426
Therapy Statistic Recent Date Time Start: 20230907211003
Therapy Statistic Recent Shocks Aborted: 0
Therapy Statistic Recent Shocks Delivered: 0
Therapy Statistic Total  Date Time End: 20231211063426
Therapy Statistic Total ATP Delivered: 0
Therapy Statistic Total Shocks Aborted: 0
Therapy Statistic Total Shocks Delivered: 0
Zone Setting Detection Beats Denominator: 32 {beats}
Zone Setting Detection Beats Numerator: 24 {beats}
Zone Setting Detection Interval: 300 ms
Zone Setting Detection Interval: 350 ms
Zone Setting Detection Interval: 350 ms
Zone Setting Detection Interval: 360 ms

## 2022-09-20 LAB — UNMAPPED LAB RESULTS: HEPATITIS C ANTIBODY (EXT): NONREACTIVE

## 2022-09-20 LAB — HEMOGLOBIN A1C
Estimated Average Glucose mg/dL (INT/EXT): 117 mg/dL (ref 68–123)
HEMOGLOBIN A1C % (INT/EXT): 5.7 % (ref <=5.7)

## 2022-10-22 ENCOUNTER — Inpatient Hospital Stay: Admit: 2022-10-22 | Payer: MEDICARE | Primary: Internal Medicine

## 2022-10-22 DIAGNOSIS — I255 Ischemic cardiomyopathy: Secondary | ICD-10-CM

## 2022-10-22 DIAGNOSIS — Z95810 Presence of automatic (implantable) cardiac defibrillator: Secondary | ICD-10-CM

## 2022-10-23 LAB — CARDIAC DEVICE CHECK - IN CLINIC
Atrial Tachy Statistic AT/AF Burden Percent: 0 %
Atrial Tachy Statistic Date Time End: 20240304192928
Atrial Tachy Statistic Date Time Start: 20230907211003
Battery Date Time of Measurements: 20240304193158
Battery RRT Trigger: 2.727
Battery Remaining Longevity: 22 mo
Battery Voltage: 2.94 V
Brady Setting AT Mode Switch Rate: 171 {beats}/min
Brady Setting Lower Rate Limit: 50 {beats}/min
Brady Setting Maximum Sensor Rate: 120 {beats}/min
Brady Setting Maximum Tracking Rate: 120 {beats}/min
Brady Setting PAV Delay Low: 140 ms
Brady Setting SAV Delay Low: 100 ms
Brady Statistic AP VP Percent: 0.17 %
Brady Statistic AP VS Percent: 0.01 %
Brady Statistic AS VP Percent: 98.3 %
Brady Statistic AS VS Percent: 1.52 %
Brady Statistic Date Time End: 20240304192928
Brady Statistic Date Time Start: 20230907211003
Brady Statistic RA Percent Paced: 0.18 %
Brady Statistic RV Percent Paced: 1.59 %
CRT LV-RV Delay: 10 ms
CRT Statistic CRT Percent Paced: 98.22 %
CRT Statistic Date Time End: 20240304192928
CRT Statistic Date Time Start: 20230907211003
CRT Statistic LV Percent Paced: 98.22 %
Capacitor Charge Energy: 18 J
Capacitor Charge Time: 4.274
Capacitor Last Charge Date Time: 20240113232826
Date Time Interrogation Session: 20240304192928
Episode Date Time: 20240109180643
Episode Date Time: 20240221172545
Episode Date Time: 20240221172632
Episode Date Time: 20240221173558
Episode Date Time: 20240223174724
Episode Date Time: 20240301181000
Episode Date Time: 20240301181157
Episode Date Time: 20240302180826
Episode Duration: 12 s
Episode Duration: 1454 s
Episode Duration: 18 s
Episode Duration: 25 s
Episode Duration: 29 s
Episode Duration: 30 s
Episode Duration: 5 s
Episode Duration: 6 s
Episode Identifier: 1127
Episode Identifier: 1179
Episode Identifier: 1180
Episode Identifier: 1181
Episode Identifier: 1182
Episode Identifier: 1183
Episode Identifier: 1184
Episode Identifier: 1185
Episode Statistic Recent Count: 0
Episode Statistic Recent Count: 0
Episode Statistic Recent Count: 0
Episode Statistic Recent Count: 0
Episode Statistic Recent Count: 0
Episode Statistic Recent Count: 0
Episode Statistic Recent Count: 0
Episode Statistic Recent Date Time End: 20240304192928
Episode Statistic Recent Date Time End: 20240304192928
Episode Statistic Recent Date Time End: 20240304192928
Episode Statistic Recent Date Time End: 20240304192928
Episode Statistic Recent Date Time End: 20240304192928
Episode Statistic Recent Date Time End: 20240304192928
Episode Statistic Recent Date Time End: 20240304192928
Episode Statistic Recent Date Time Start: 20230907211003
Episode Statistic Recent Date Time Start: 20230907211003
Episode Statistic Recent Date Time Start: 20230907211003
Episode Statistic Recent Date Time Start: 20230907211003
Episode Statistic Recent Date Time Start: 20230907211003
Episode Statistic Recent Date Time Start: 20230907211003
Episode Statistic Recent Date Time Start: 20230907211003
Episode Statistic Total Count: 0
Episode Statistic Total Count: 0
Episode Statistic Total Count: 0
Episode Statistic Total Count: 0
Episode Statistic Total Count: 0
Episode Statistic Total Count: 0
Episode Statistic Total Count: 1
Episode Statistic Total Date Time End: 20240304192928
Episode Statistic Total Date Time End: 20240304192928
Episode Statistic Total Date Time End: 20240304192928
Episode Statistic Total Date Time End: 20240304192928
Episode Statistic Total Date Time End: 20240304192928
Episode Statistic Total Date Time End: 20240304192928
Episode Statistic Total Date Time End: 20240304192928
Episode Statistic Total Date Time Start: 19940101000000
Episode Statistic Total Date Time Start: 19940101000000
Episode Statistic Total Date Time Start: 19940101000000
Episode Statistic Total Date Time Start: 19940101000000
Episode Statistic Total Date Time Start: 19940101000000
Episode Statistic Total Date Time Start: 19940101000000
Episode Statistic Total Date Time Start: 19940101000000
Implantable Lead Implant Date: 20191119
Implantable Lead Implant Date: 20191119
Implantable Lead Implant Date: 20191119
Implantable Lead Model: 4298
Implantable Lead Model: 5076
Implantable Pulse Generator Implant Date: 20191119
Lead Channel Impedance Value: 1083 Ohm
Lead Channel Impedance Value: 1197 Ohm
Lead Channel Impedance Value: 1197 Ohm
Lead Channel Impedance Value: 1197 Ohm
Lead Channel Impedance Value: 1235 Ohm
Lead Channel Impedance Value: 294.5 Ohm
Lead Channel Impedance Value: 331.831
Lead Channel Impedance Value: 331.831
Lead Channel Impedance Value: 335.403
Lead Channel Impedance Value: 335.403
Lead Channel Impedance Value: 456 Ohm
Lead Channel Impedance Value: 551 Ohm
Lead Channel Impedance Value: 589 Ohm
Lead Channel Impedance Value: 589 Ohm
Lead Channel Impedance Value: 589 Ohm
Lead Channel Impedance Value: 760 Ohm
Lead Channel Impedance Value: 779 Ohm
Lead Channel Impedance Value: 931 Ohm
Lead Channel Pacing Threshold Amplitude: 0.375 V
Lead Channel Pacing Threshold Amplitude: 0.5 V
Lead Channel Pacing Threshold Amplitude: 0.875 V
Lead Channel Pacing Threshold Pulse Width: 0.4 ms
Lead Channel Pacing Threshold Pulse Width: 0.4 ms
Lead Channel Pacing Threshold Pulse Width: 0.4 ms
Lead Channel Sensing Intrinsic Amplitude: 17.5 mV
Lead Channel Sensing Intrinsic Amplitude: 2.125 mV
Lead Channel Sensing Intrinsic Amplitude: 2.75 mV
Lead Channel Sensing Intrinsic Amplitude: 23.625 mV
Lead Channel Setting Pacing Amplitude: 1.5 V
Lead Channel Setting Pacing Amplitude: 1.5 V
Lead Channel Setting Pacing Amplitude: 2 V
Lead Channel Setting Pacing Pulse Width: 0.4 ms
Lead Channel Setting Pacing Pulse Width: 0.4 ms
Lead Channel Setting Pacing Pulse Width: 0.7 ms
Lead Channel Setting Sensing Sensitivity: 0.3 mV
Lead Channel Setting Sensing Sensitivity: 0.45 mV
Therapy Statistic Recent ATP Delivered: 0
Therapy Statistic Recent Date Time End: 20240304192928
Therapy Statistic Recent Date Time Start: 20230907211003
Therapy Statistic Recent Shocks Aborted: 0
Therapy Statistic Recent Shocks Delivered: 0
Therapy Statistic Total  Date Time End: 20240304192928
Therapy Statistic Total  Date Time Start: 19940101000000
Therapy Statistic Total ATP Delivered: 0
Therapy Statistic Total Shocks Aborted: 0
Therapy Statistic Total Shocks Delivered: 0
Zone Setting Detection Beats Denominator: 32 {beats}
Zone Setting Detection Beats Numerator: 24 {beats}
Zone Setting Detection Interval: 300 ms
Zone Setting Detection Interval: 350 ms
Zone Setting Detection Interval: 350 ms
Zone Setting Detection Interval: 360 ms

## 2023-01-21 ENCOUNTER — Encounter: Primary: Internal Medicine

## 2023-04-19 NOTE — Telephone Encounter (Signed)
Department Name: Cardiology    Patient: Stephanie [name]  MRN: [id number]  Agent: [facility] Scheduling Message    Patient requesting call back from: Dr. Cheri Rous    In regards to: Patient states that she always has an echo done before f/u appt with Dr. Cheri Rous. Patient has an upcoming appt  Fri 10/4 with Dr Cheri Rous and would like her echo to be scheduled same day before her f/u appt to go over results.    Best call back number: [phone]

## 2023-04-29 ENCOUNTER — Ambulatory Visit: Payer: MEDICARE | Primary: Internal Medicine

## 2023-04-29 ENCOUNTER — Ambulatory Visit: Payer: MEDICARE | Attending: Cardiovascular Disease | Primary: Internal Medicine

## 2023-05-13 ENCOUNTER — Ambulatory Visit: Admit: 2023-05-13 | Payer: MEDICARE | Attending: Cardiovascular Disease | Primary: Internal Medicine

## 2023-05-13 ENCOUNTER — Inpatient Hospital Stay: Admit: 2023-05-13 | Payer: MEDICARE | Primary: Internal Medicine

## 2023-05-13 DIAGNOSIS — Z4502 Encounter for adjustment and management of automatic implantable cardiac defibrillator: Secondary | ICD-10-CM

## 2023-05-13 DIAGNOSIS — I429 Cardiomyopathy, unspecified: Secondary | ICD-10-CM

## 2023-05-13 DIAGNOSIS — I428 Other cardiomyopathies: Secondary | ICD-10-CM

## 2023-05-13 NOTE — Addendum Note (Signed)
Addended by: Emeline Darling on: 05/15/2023 04:43 PM     Modules accepted: Orders

## 2023-05-13 NOTE — Assessment & Plan Note (Signed)
Ms. Goodbar feels well with no complaints. NYHA class I symptoms, euvolemic on exam.  Last echo 04/25/22 showed LVEF 50-55%. She is scheduled to have repeat echo next month and will follow up Dr. Cheri Rous.

## 2023-05-13 NOTE — Progress Notes (Signed)
Reason for Visit: nicmp, CRT-D follow up    HPI:   Stephanie [name] is a 72 y.o. year old female  who has had a long-standing history of exertional dyspnea and chest discomfort. She splits her time between her home in [place], [place] and here in the [place] where her son lives in [place] and her daughter in [place].  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 Cardiac catheterization in 2008 failed to demonstrate any occlusive disease. At some point she was diagnosed with mitral regurgitation and was in fact referred for mitral valve repair/replacement before the cardiothoracic surgeon determined that her mitral regurgitation was functional due to her underlying cardiomyopathy. She was started on atenolol and enalapril and she continued following with her cardiologist periodically. In 2017 she had an LVEF 35-40% with a mildly dilated left ventricle and mild mitral regurgitation.  She was referred for consideration of cardiac resynchronization therapy in July 2019 after an echocardiogram showed an LVEF of 35%.  She underwent uneventful CRT-D implant on 07/08/2018.  In October 2020 her echocardiogram showed an LVEF of 45 to 50%. Echocardiogram in December 2021 showed an LVEF 45 to 50%.  Due to diaphragmatic pacing, at her visit on 05/01/2021, LV capture management was turned off and the amplitude was set at 2.5 V@0 .4 ms.  Pulse width was also increased 2.7 ms had a capture threshold of 1.75 V to minimize any diaphragmatic pacing.  The chest x-ray failed to demonstrate any migration of her LV lead. Her most recent Echo 04/25/22 showed LVEF 50-55%.     Stephanie [name] presents today for follow up.  She is accompanied by her husband.  She reports doing well.  She denies palpitations, chest pain, presyncope or syncope.  She is able to walk 40 minutes on a flat surface without SOB.  She has mild DOE on climbing a flight of stairs.  Her CRT-D was  interrogated today which shows normal Bi-V ICD function,  no new atrial arrhythmias, no  new ventricular arrhythmia detections,  <1 % atrial pacing,  98 % left ventricular pacing, good heart rate distribution.  She reports she occasionally feels the LV pacing sensation, usually when she lays down on her left side.  She states it is tolerable and she is able to reposition herself to stop it and does not feel like anything needs to be changed at this time.         Past Medical History:   Diagnosis Date    Gastritis     Graves disease (CMS-HCC) 2010    s/p radioactive ablation    Hyperlipidemia (CMS-HCC)     Hypertension (CMS-HCC)     LBBB (left bundle branch block) 06/2018    now s/p CRT-D    Mild mitral regurgitation     Nonischemic cardiomyopathy (Multi-HCC)     chronic HFrEf    Osteoarthritis           Past Surgical History:   Procedure Laterality Date    BREAST LUMPECTOMY          Medications:   Current Outpatient Medications   Medication Instructions    aspirin 81 mg, oral, Daily RT    atorvastatin (LIPITOR) 10 mg, oral, Daily RT    bisoprolol (ZEBETA) 2.5 mg, oral, Daily RT    furosemide (LASIX) 20 mg, oral, Daily RT    levothyroxine (SYNTHROID) 100 mcg, oral, Daily RT    omeprazole (  PRILOSEC) 10 mg, oral, Daily RT    sacubitriL-valsartan (Entresto) 97-103 mg tablet 1 tablet, oral, 2 times daily    spironolactone (ALDACTONE) 25 mg, oral, Daily       Allergies:  Patient has no known allergies.      reports that she has never smoked. She has never used smokeless tobacco. She reports that she does not drink alcohol and does not use drugs.     Family History   Problem Relation Name Age of Onset    Coronary artery disease Mother      Heart disease Father      No Known Problems Daughter      No Known Problems Son      No Known Problems Son         ROS  Pertinent positives listed in HPI. Comprehensive ROS otherwise negative.    Physical Exam:     Gen: No acute distress, comfortable, alert  Lungs: Clear to auscultation  bilaterally  CV: regular rate and rhythm, no murmurs, rubs, or gallops  Ext: no LE edema    Visit Vitals  BP 100/62 (BP Location: Left arm, Patient Position: Sitting)   Pulse 63   Ht 1.549 m   Wt 68.5 kg   SpO2 99%   BMI 28.53 kg/m   BSA 1.72 m     Echocardiogram: 04/25/22  Left Ventricle  The left ventricle is normal in size. There is normal left ventricular wall thickness. Ejection  Fraction = 50-55%. Left ventricular systolic function is normal. A full diastolic function study was  completed with echocardiographic findings of Grade I diastolic dysfunction. No regional wall motion  abnormalities noted. The interventricular septum is asynchronous.     Right Ventricle  The right ventricle is grossly normal size. There is a device lead in the right ventricle. The right  ventricular systolic function is normal.     Atria  The left atrium is moderately dilated. Right atrial size is normal. The inferior vena cava is normal  size with > 50% respirophasic variation, consistent with normal right atrial pressure. There is no  Color Doppler evidence for an atrial septal defect.     Mitral Valve  There is mild mitral annular calcification. There is no mitral valve stenosis. There is mild mitral  regurgitation.     Tricuspid Valve  Tricuspid leaflets are thickened. There is no tricuspid stenosis. There is mild tricuspid  regurgitation. Right ventricular systolic pressure is normal.     Aortic Valve  The aortic valve is trileaflet. The aortic valve opens well. No hemodynamically significant valvular  aortic stenosis. Mild aortic regurgitation.     Pulmonic Valve  The pulmonic valve is not well visualized. There is no pulmonic valvular stenosis. Trace pulmonic  valvular regurgitation.     Great Vessels  The aortic root is normal size. The ascending aorta is normal size. No Doppler or imaging evidence  of an aortic coarctation.     Pericardium/Pleural  There is no pericardial effusion. Prominent epicardial fat pad.      Interpretation Summary  I have personally reviewed the study and confirm the fellow's findings. Please see full report for  findings.  Ejection Fraction = 50-55%. Left ventricular systolic function is normal.  No regional wall motion abnormalities noted.  The right ventricle is grossly normal size. The right ventricular systolic function is normal.  Compared to prior study from 08/10/20, there is improvement in LV systolic function.     (08/10/2020): Normal-sized LV cavity  with normal wall thickness and mildly reduced LV systolic function, LVEF 45 to 16%.     CXR: (05/01/21): No acute abnormality    DEVICE : Normal Bi-V ICD function,  no new atrial arrhythmias, no  new ventricular arrhythmia detections,  <1 % atrial pacing,  98 % left ventricular pacing, good heart rate distribution.  Patient states she occasionally feels the LV pacing sensation, usually when she lays down on her left side.  She states it is tolerable and does not feel like anything needs to be changed at this time.   Home remote monitoring reviewed and [employer] number given so patient can inquire about receiving an additional monitor to leave in [place] half of the year.      Assessment and Plan:  Problem List Items Addressed This Visit          Circulatory    Non-ischemic cardiomyopathy (Multi-HCC) - Primary    Current Assessment & Plan     Ms. Riedlinger feels well with no complaints. NYHA class I symptoms, euvolemic on exam.  Last echo 04/25/22 showed LVEF 50-55%. She is scheduled to have repeat echo next month and will follow up Dr. Cheri Rous.             Presence of cardiac resynchronization therapy defibrillator (CRT-D)    Current Assessment & Plan     Normal Bi-V ICD function,  no new atrial arrhythmias, no  new ventricular arrhythmia detections,  <1 % atrial pacing,  98 % left ventricular pacing, good heart rate distribution.  Patient states she occasionally feels the LV pacing sensation, usually when she lays down on her left side.  She states  it is tolerable and does not feel like anything needs to be changed at this time.   Home remote monitoring will continue.                Medication changes made this visit: NONE     Next recommended follow-up Visit:  1 year    On the day of the encounter I spent 35 minutes on the total patient encounter, including time reviewing tests and/or medical records, taking a history, performing a medically appropriate examination and/or evaluation, counseling and educating the patient/family/caregiver, completing clinical documentation in the electronic or other health record, independently interpreting test results and communicating results to the patient/family/caregiver and care coordination including communication with referring physician(s).     Lavell Luster, NP

## 2023-05-13 NOTE — Assessment & Plan Note (Signed)
Normal Bi-V ICD function,  no new atrial arrhythmias, no  new ventricular arrhythmia detections,  <1 % atrial pacing,  98 % left ventricular pacing, good heart rate distribution.  Patient states she occasionally feels the LV pacing sensation, usually when she lays down on her left side.  She states it is tolerable and does not feel like anything needs to be changed at this time.   Home remote monitoring will continue.

## 2023-05-15 LAB — CARDIAC DEVICE CHECK - IN CLINIC
Atrial Tachy Statistic AT/AF Burden Percent: 0 %
Atrial Tachy Statistic Date Time End: 20240923184503
Atrial Tachy Statistic Date Time Start: 20240304192949
Battery Date Time of Measurements: 20240923184710
Battery RRT Trigger: 2.727
Battery Remaining Longevity: 22 mo
Battery Voltage: 2.93 V
Brady Setting AT Mode Switch Rate: 171 {beats}/min
Brady Setting Lower Rate Limit: 50 {beats}/min
Brady Setting Maximum Sensor Rate: 120 {beats}/min
Brady Setting Maximum Tracking Rate: 120 {beats}/min
Brady Setting PAV Delay Low: 140 ms
Brady Setting SAV Delay Low: 120 ms
Brady Statistic AP VP Percent: 0.28 %
Brady Statistic AP VS Percent: 0.01 %
Brady Statistic AS VP Percent: 98.18 %
Brady Statistic AS VS Percent: 1.53 %
Brady Statistic Date Time End: 20240923184503
Brady Statistic Date Time Start: 20240304192949
Brady Statistic RA Percent Paced: 0.29 %
Brady Statistic RV Percent Paced: 1.43 %
CRT Statistic CRT Percent Paced: 98.13 %
CRT Statistic Date Time End: 20240923184503
CRT Statistic Date Time Start: 20240304192949
CRT Statistic LV Percent Paced: 98.13 %
Capacitor Charge Energy: 18 J
Capacitor Charge Time: 4.324
Capacitor Last Charge Date Time: 20240715125706
Date Time Interrogation Session: 20240923184503
Episode Date Time: 20240911193942
Episode Date Time: 20240911194028
Episode Date Time: 20240912162749
Episode Date Time: 20240912164021
Episode Date Time: 20240912164243
Episode Date Time: 20240912164439
Episode Date Time: 20240921132638
Episode Duration: 18 s
Episode Duration: 19 s
Episode Duration: 29 s
Episode Duration: 31 s
Episode Duration: 33 s
Episode Duration: 34 s
Episode Duration: 4 s
Episode Identifier: 1377
Episode Identifier: 1378
Episode Identifier: 1379
Episode Identifier: 1380
Episode Identifier: 1381
Episode Identifier: 1382
Episode Identifier: 1383
Episode Statistic Recent Count: 0
Episode Statistic Recent Count: 0
Episode Statistic Recent Count: 0
Episode Statistic Recent Count: 0
Episode Statistic Recent Count: 0
Episode Statistic Recent Count: 0
Episode Statistic Recent Count: 0
Episode Statistic Recent Date Time End: 20240923184503
Episode Statistic Recent Date Time End: 20240923184503
Episode Statistic Recent Date Time End: 20240923184503
Episode Statistic Recent Date Time End: 20240923184503
Episode Statistic Recent Date Time End: 20240923184503
Episode Statistic Recent Date Time End: 20240923184503
Episode Statistic Recent Date Time End: 20240923184503
Episode Statistic Recent Date Time Start: 20240304192949
Episode Statistic Recent Date Time Start: 20240304192949
Episode Statistic Recent Date Time Start: 20240304192949
Episode Statistic Recent Date Time Start: 20240304192949
Episode Statistic Recent Date Time Start: 20240304192949
Episode Statistic Recent Date Time Start: 20240304192949
Episode Statistic Recent Date Time Start: 20240304192949
Episode Statistic Total Count: 0
Episode Statistic Total Count: 0
Episode Statistic Total Count: 0
Episode Statistic Total Count: 0
Episode Statistic Total Count: 0
Episode Statistic Total Count: 0
Episode Statistic Total Count: 1
Episode Statistic Total Date Time End: 20240923184503
Episode Statistic Total Date Time End: 20240923184503
Episode Statistic Total Date Time End: 20240923184503
Episode Statistic Total Date Time End: 20240923184503
Episode Statistic Total Date Time End: 20240923184503
Episode Statistic Total Date Time End: 20240923184503
Episode Statistic Total Date Time End: 20240923184503
Episode Statistic Total Date Time Start: 19940101000000
Episode Statistic Total Date Time Start: 19940101000000
Episode Statistic Total Date Time Start: 19940101000000
Episode Statistic Total Date Time Start: 19940101000000
Episode Statistic Total Date Time Start: 19940101000000
Episode Statistic Total Date Time Start: 19940101000000
Episode Statistic Total Date Time Start: 19940101000000
Implantable Lead Implant Date: 20191119
Implantable Lead Implant Date: 20191119
Implantable Lead Implant Date: 20191119
Implantable Lead Model: 4298
Implantable Lead Model: 5076
Implantable Pulse Generator Implant Date: 20191119
Lead Channel Impedance Value: 1064 Ohm
Lead Channel Impedance Value: 1178 Ohm
Lead Channel Impedance Value: 1197 Ohm
Lead Channel Impedance Value: 1235 Ohm
Lead Channel Impedance Value: 1254 Ohm
Lead Channel Impedance Value: 278.237
Lead Channel Impedance Value: 309.309
Lead Channel Impedance Value: 315.129
Lead Channel Impedance Value: 341.479
Lead Channel Impedance Value: 348.587
Lead Channel Impedance Value: 456 Ohm
Lead Channel Impedance Value: 513 Ohm
Lead Channel Impedance Value: 532 Ohm
Lead Channel Impedance Value: 589 Ohm
Lead Channel Impedance Value: 608 Ohm
Lead Channel Impedance Value: 779 Ohm
Lead Channel Impedance Value: 817 Ohm
Lead Channel Impedance Value: 950 Ohm
Lead Channel Pacing Threshold Amplitude: 0.5 V
Lead Channel Pacing Threshold Amplitude: 0.625 V
Lead Channel Pacing Threshold Amplitude: 0.875 V
Lead Channel Pacing Threshold Pulse Width: 0.4 ms
Lead Channel Pacing Threshold Pulse Width: 0.4 ms
Lead Channel Pacing Threshold Pulse Width: 0.4 ms
Lead Channel Sensing Intrinsic Amplitude: 15.875 mV
Lead Channel Sensing Intrinsic Amplitude: 2.5 mV
Lead Channel Sensing Intrinsic Amplitude: 2.75 mV
Lead Channel Sensing Intrinsic Amplitude: 22.375 mV
Lead Channel Setting Pacing Amplitude: 1.5 V
Lead Channel Setting Pacing Amplitude: 1.5 V
Lead Channel Setting Pacing Amplitude: 2 V
Lead Channel Setting Pacing Pulse Width: 0.4 ms
Lead Channel Setting Pacing Pulse Width: 0.4 ms
Lead Channel Setting Pacing Pulse Width: 0.7 ms
Lead Channel Setting Sensing Sensitivity: 0.3 mV
Lead Channel Setting Sensing Sensitivity: 0.45 mV
Therapy Statistic Recent ATP Delivered: 0
Therapy Statistic Recent Date Time End: 20240923184503
Therapy Statistic Recent Date Time Start: 20240304192949
Therapy Statistic Recent Shocks Aborted: 0
Therapy Statistic Recent Shocks Delivered: 0
Therapy Statistic Total  Date Time End: 20240923184503
Therapy Statistic Total  Date Time Start: 19940101000000
Therapy Statistic Total ATP Delivered: 0
Therapy Statistic Total Shocks Aborted: 0
Therapy Statistic Total Shocks Delivered: 0
Zone Setting Detection Beats Denominator: 32 {beats}
Zone Setting Detection Beats Numerator: 24 {beats}
Zone Setting Detection Interval: 300 ms
Zone Setting Detection Interval: 350 ms
Zone Setting Detection Interval: 350 ms
Zone Setting Detection Interval: 360 ms

## 2023-05-24 ENCOUNTER — Ambulatory Visit: Payer: MEDICARE | Attending: Cardiovascular Disease | Primary: Internal Medicine

## 2023-05-24 ENCOUNTER — Ambulatory Visit: Payer: MEDICARE | Primary: Internal Medicine

## 2023-06-12 ENCOUNTER — Inpatient Hospital Stay: Admit: 2023-06-12 | Payer: MEDICARE | Primary: Internal Medicine

## 2023-06-12 ENCOUNTER — Ambulatory Visit: Payer: MEDICARE | Attending: Cardiovascular Disease | Primary: Internal Medicine

## 2023-06-12 ENCOUNTER — Ambulatory Visit
Admit: 2023-06-12 | Discharge: 2023-06-12 | Payer: MEDICARE | Attending: Cardiovascular Disease | Primary: Internal Medicine

## 2023-06-12 DIAGNOSIS — I428 Other cardiomyopathies: Secondary | ICD-10-CM

## 2023-06-12 NOTE — Progress Notes (Signed)
Heart Failure Clinic Note      Patient:  Stephanie [name]   DOB:  [birth year]   MRN:     [id number] Age:   72 y.o.   Encounter Date: 06/12/2023 Sex:    female     Reason for Visit: 1 yr Follow-up    Associated Providers: PCP: Pcp No, MD    It was a pleasure to see Stephanie [name] in the Heart Failure Clinic at [facility].  Stephanie [name] is a 33 y.o. year old female with a PMH of NICM, LBBB s/p CRT-D 2019 with subsequent normalization of LV function, HTN, HLD and graves disease sp radioactive ablation who presents for continued management .    First presented with dyspnea in the early 1990s. In 2008 she had LHC that did not show evidence of coronary artery disease. TTE showed mild to moderate mitral valve regurgitation and LVEDD at 5.6 cm. She was started on atenolol and enalapril and she continued following with her cardiologist periodically. In 2010 she was diagnosed with Graves disease and she received radioactive iodine ablation.    In 2016, she had progressive symptoms and a TEE suggested severe MR. However on surgical review, the MR was recognized to be less severe and functional in nature. Her LV was more dilated compared to 2008 (6.1 cm) and her EF had dropped to 40%. The patient was started on bisoprolol, entresto and lasix about three months.    In 2017 she had an LVEF 35-40% with a mildly dilated left ventricle and mild mitral regurgitation. She underwent uneventful CRT-D implant on 07/08/2018.  In October 2020 her echocardiogram showed an LVEF of 45 to 50%.    Since that time, she has been stable from a HF standpoint.    Today 06/12/2023, she reports feeling well from a cardiac standpoint. She gets dyspnea with climbing hills that has been stable. Mild dyspnea with climbing stairs. No orthopnea or PND. No LE edema. She has occasional lightheadedness but no syncope. No ICD shocks.    Medications:  Current Outpatient Medications   Medication Instructions    aspirin 81 mg, oral, Daily RT    atorvastatin  (LIPITOR) 10 mg, oral, Daily RT    bisoprolol (ZEBETA) 2.5 mg, oral, Daily RT    dapagliflozin propanediol (Farxiga) 10 mg oral    furosemide (LASIX) 20 mg, oral, Daily RT    levothyroxine (SYNTHROID) 100 mcg, oral, Daily RT    omeprazole (PRILOSEC) 10 mg, oral, Daily RT    sacubitriL-valsartan (Entresto) 97-103 mg tablet 1 tablet, oral, 2 times daily    spironolactone (ALDACTONE) 25 mg, oral, Daily     Allergies: No Known Allergies    Past Medical History:   Diagnosis Date    Gastritis     Graves disease (CMS-HCC) 2010    s/p radioactive ablation    Hyperlipidemia (CMS-HCC)     Hypertension (CMS-HCC)     LBBB (left bundle branch block) 06/2018    now s/p CRT-D    Mild mitral regurgitation     Nonischemic cardiomyopathy (Multi-HCC)     chronic HFrEf    Osteoarthritis      Past Surgical History:   Procedure Laterality Date    BREAST LUMPECTOMY       Social History     Tobacco Use    Smoking status: Never    Smokeless tobacco: Never   Substance Use Topics    Alcohol use: Never    Drug use: Never  Family History   Problem Relation Name Age of Onset    Coronary artery disease Mother      Heart disease Father      No Known Problems Daughter      No Known Problems Son      No Known Problems Son       Visit Vitals  BP 120/67 (BP Location: Left arm, Patient Position: Sitting, BP Cuff Size: Adult)   Pulse 68   Temp 36.6 C (97.9 F) (Oral)   Ht 1.549 m   Wt 64 kg   SpO2 98%   BMI 26.69 kg/m   BSA 1.66 m         Weight trend:  Wt Readings from Last 3 Encounters:   06/12/23 64 kg   05/13/23 68.5 kg   04/25/22 65.9 kg       Physical Exam:  appears well  JVP unelevated  hs S1+S2+, no murmurs   lungs clear  abdomen soft and non-tender  no LE edema, warm     Review of Systems:  A 12-point review of systems was completed and negative unless otherwise reported in the HPI.    Cardiac Testing:  ECG 08/10/20: A-sensed sinus rhythm, BiV paced    ECG 2019: Sinus rhythm, LBBB      TTE 05/2023: LV normal size, LVEF 50-55%. RV normal  size and function. Mild MR, mild TR. Mild AI. No significant change from prior.    Echo 04/25/2022: The left ventricle is normal in size. There is normal left ventricular wall thickness. Ejection Fraction = 50-55%. Left ventricular systolic function is normal. A full diastolic function study was completed with echocardiographic findings of Grade I diastolic dysfunction. No  regional wall motion abnormalities noted. The interventricular septum is asynchronous. The right ventricle is grossly normal size. There is a device lead in the right ventricle. The right ventricular systolic function is normal. The left atrium is moderately dilated. Right atrial size is normal. The inferior vena cava is normal size with > 50% respirophasic variation, consistent with normal right atrial pressure. There is no Color Doppler evidence for an atrial septal defect. There is mild mitral annular calcification. There is no mitral valve stenosis. There is mild mitral regurgitation. Tricuspid leaflets are thickened. There is no tricuspid stenosis. There is mild tricuspid regurgitation. Right ventricular systolic pressure is normal. The aortic valve is trileaflet. The aortic valve opens well. No hemodynamically significant valvular aortic stenosis. Mild aortic regurgitation. The pulmonic valve is not well visualized. There is no pulmonic valvular stenosis. Trace pulmonic valvular regurgitation. The aortic root is normal size. The ascending aorta is normal size. No Doppler or imaging evidence of an aortic coarctation. There is no pericardial effusion. Prominent epicardial fat pad.      Echo 08/10/2020:  The left ventricle is normal in size. There is normal left ventricular wall thickness. Left ventricular systolic function is mildly reduced. Ejection Fraction =  45-50%.  Global longitudinal strain is - 15%. The tissue Doppler diastolic parameters are indeterminate. There is mild global hypokinesis of the left ventricle. The interventricular  septum is asynchronous.  The right ventricle is normal in size and function  . There is a device lead in the right ventricle. TAPSE is 1.8cm. The left atrium is mildly dilated. Right atrial size is normal. The inferior vena cava is normal size with > 50% respirophasic variation, consistent with normal right atrial pressure. There is no Color Doppler evidence for an atrial septal defect. There is mild  mitral annular calcification. The mitral valve leaflets appear mildly thickened,. There is no mitral valve  stenosis. There is mild mitral regurgitation. The tricuspid valve is normal in structure. There is mild tricuspid regurgitation. Right ventricular systolic pressure is normal. The aortic valve is mildly thickened. The aortic valve is trileaflet. The aortic valve opens well. No hemodynamically significant valvular aortic stenosis. Mild aortic regurgitation. The pulmonic valve is not well seen, but is grossly normal. Trace pulmonic valvular regurgitation. The aortic root is normal size. The ascending aorta is normal size. There is no pericardial effusion. Prominent anterior fat pad.       Echo 03/14/16: The left ventricle is mildly dilated. There is no thrombus. There is normal left ventricular wall thickness. Left ventricular systolic function is moderately reduced.Ejection Fraction = 35-40%. The interventricular septum is asynchronous. Basal-mid septal akinesis. Mild mitral and aortic valve regurgitation.    08/10/20 CRT-D interrogation: Normal DDD CRT-D function, no changes made to settings. Histogram shows 0.3% atrial pacing, 98.3% bi- ventricular pacing, good heart rate distribution, no new atrial or ventricular arrhythmias since last visit in October 2020. LV capture threshold 2.75V at 0.66ms. Home remote monitoring will continue. The patient will return to clinic in 1 year.        05/27/19 CRT-D interrogation: Normal dual CRT ICD function, no new atrial arrhythmias, no new ventricular arrhythmia  detections . 27  % atrial pacing, 97 % biventricular pacing, good heart rate distribution. Battery longevity is 7 years, home monitoring will continue.      LHC 2008: without evidence of CAD per patient report      Blood Work:  No results found for: "NA", "K", "CL", "CO2", "BUN", "CREATININE", "GLUCOSE", "CALCIUM", "MG", "BNP", "NT", "HGBA1C", "TSH", "TROPONINI", "TROPOT", "FERRITIN", "IRONSAT" No results found for: "BNP", "NTPROBNP"       Assessment/Plan:    In summary, Anamari Sinohui is a 72 y.o. year old with a long-standing history of chronic HFrEF, NICM, LBBB and mild MR who is now s/p CRT-D in 06/2018.     Nonischemic cardiomyopathy  HFimpEF  NYHA class: NYHA Class: II  ACC stage: C    Medications:  Evidence-based beta blocker: bisoprolol 2.5 daily  ACE/ARB/ARNI: entresot 97/103  MRA: spiro 25  SGLT2i: dapa 10  Diuretic: lasix 20 daily  Other: none    Device: CRT-D    Advanced therapies status: not indicated.    Euvolemic and well compensated today. TTE showed stable LVEF 50-55%. Already on max tolerated medications. Labs from [place] 02/2023 reviewed. No changes today.    LBBB, now s/p CRT  Continues device interrogations with [place], no arrhythmias thus far.     Disposition/Follow up  Next clinic visit: 1 year     Jiles Garter, MD 06/12/23, 11:26 AM    On the day of the encounter I spent 30 minutes on the total patient encounter, including time reviewing tests and/or medical records, taking a history, performing a medically appropriate examination and/or evaluation, counseling and educating the patient/family/caregiver, completing clinical documentation in the electronic or other health record, independently interpreting test results and communicating results to the patient/family/caregiver and care coordination including communication with referring provider(s).

## 2023-06-12 NOTE — Progress Notes (Deleted)
Heart Failure Clinic Note      Patient:  Stephanie [name]   DOB:  05-29-1951   MRN:     [id number] Age:   72 y.o.   Encounter Date: 06/12/2023 Sex:    female     Reason for Visit: 1 yr Follow-up    Associated Providers: PCP: Pcp No, MD    It was a pleasure to see Stephanie [name] in the Heart Failure Clinic at [facility]  Stephanie [name] is a 72 y.o. year old female with a PMH of NICM, LBBB s/p CRT-D 2019 with subsequent normalization of LV function, HTN, HLD and graves disease sp radioactive ablation who presents for continued management .    First presented with dyspnea in the early 1990s. In 2008 she had LHC that did not show evidence of coronary artery disease. TTE showed mild to moderate mitral valve regurgitation and LVEDD at 5.6 cm. She was started on atenolol and enalapril and she continued following with her cardiologist periodically. In 2010 she was diagnosed with Graves disease and she received radioactive iodine ablation.    In 2016, she had progressive symptoms and a TEE suggested severe MR. However on surgical review, the MR was recognized to be less severe and functional in nature. Her LV was more dilated compared to 2008 (6.1 cm) and her EF had dropped to 40%. The patient was started on bisoprolol, entresto and lasix about three months.    In 2017 she had an LVEF 35-40% with a mildly dilated left ventricle and mild mitral regurgitation. She underwent uneventful CRT-D implant on 07/08/2018.  In October 2020 her echocardiogram showed an LVEF of 45 to 50%.    Since that time, she has been stable from a HF standpoint.    Today 06/12/2023, she reports ***    Today, 06/12/2023,  Stephanie [name] reports feeling generally okay, although not at 100% capacity. She not had any hospitalizations. However, she has noticed a change in her ability to climb stairs - she is unable to ascend them as easily as before. When she does go up stairs, she experiences shortness of breath and needs to take breaks before continuing.  She also mentions experiencing increased dyspnea on exertion and it takes her a few seconds to recover. While she does feel more tired than before, she is still able to keep up with her family. 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 She is actively exercising her muscles in preparation for the procedure. Despite experiencing some pain while walking, it tends to subside after engaging in certain activities. She can walk for approximately 20-25 minutes, but experiences dyspnea on exertion when walking uphill or climbing stairs. The patient denies any dizziness, lightheadedness, syncope, or recent hospitalizations. She does mention feeling dizzy only when she turns her head quickly. She had a CT scan done for blood in the urine, and her liver function and blood tests were conducted in [place]. Occasionally, she experiences bloating in her abdomen. Her weight has remained steady.    Medications:  Current Outpatient Medications   Medication Instructions    aspirin 81 mg, oral, Daily RT    atorvastatin (LIPITOR) 10 mg, oral, Daily RT    bisoprolol (ZEBETA) 2.5 mg, oral, Daily RT    furosemide (LASIX) 20 mg, oral, Daily RT    levothyroxine (SYNTHROID) 100 mcg, oral, Daily RT    omeprazole (PRILOSEC) 10 mg, oral, Daily RT  sacubitriL-valsartan (Entresto) 97-103 mg tablet 1 tablet, oral, 2 times daily    spironolactone (ALDACTONE) 25 mg, oral, Daily     Allergies: No Known Allergies    Past Medical History:   Diagnosis Date    Gastritis     Graves disease (CMS-HCC) 2010    s/p radioactive ablation    Hyperlipidemia (CMS-HCC)     Hypertension (CMS-HCC)     LBBB (left bundle branch block) 06/2018    now s/p CRT-D    Mild mitral regurgitation     Nonischemic cardiomyopathy (Multi-HCC)     chronic HFrEf    Osteoarthritis      Past Surgical History:   Procedure Laterality Date    BREAST LUMPECTOMY       Social History     Tobacco Use     Smoking status: Never    Smokeless tobacco: Never   Substance Use Topics    Alcohol use: Never    Drug use: Never     Family History   Problem Relation Name Age of Onset    Coronary artery disease Mother      Heart disease Father      No Known Problems Daughter      No Known Problems Son      No Known Problems Son       Physical Exam:  appears well  JVP unelevated  hs S1+S2+1/6 PSM   lungs clear  abdomen soft and non-tender  no LE edema, warm     Review of Systems:  A 12-point review of systems was completed and negative unless otherwise reported in the HPI.    There were no vitals taken for this visit.      Weight trend:  Wt Readings from Last 3 Encounters:   05/13/23 68.5 kg   04/25/22 65.9 kg   05/01/21 64 kg     Cardiac Testing:  ECG 08/10/20: A-sensed sinus rhythm, BiV paced    ECG 2019: Sinus rhythm, LBBB      Echo 04/25/2022: The left ventricle is normal in size. There is normal left ventricular wall thickness. Ejection Fraction = 50-55%. Left ventricular systolic function is normal. A full diastolic function study was completed with echocardiographic findings of Grade I diastolic dysfunction. No  regional wall motion abnormalities noted. The interventricular septum is asynchronous. The right ventricle is grossly normal size. There is a device lead in the right ventricle. The right ventricular systolic function is normal. The left atrium is moderately dilated. Right atrial size is normal. The inferior vena cava is normal size with > 50% respirophasic variation, consistent with normal right atrial pressure. There is no Color Doppler evidence for an atrial septal defect. There is mild mitral annular calcification. There is no mitral valve stenosis. There is mild mitral regurgitation. Tricuspid leaflets are thickened. There is no tricuspid stenosis. There is mild tricuspid regurgitation. Right ventricular systolic pressure is normal. The aortic valve is trileaflet. The aortic valve opens well. No hemodynamically  significant valvular aortic stenosis. Mild aortic regurgitation. The pulmonic valve is not well visualized. There is no pulmonic valvular stenosis. Trace pulmonic valvular regurgitation. The aortic root is normal size. The ascending aorta is normal size. No Doppler or imaging evidence of an aortic coarctation. There is no pericardial effusion. Prominent epicardial fat pad.      Echo 08/10/2020:  The left ventricle is normal in size. There is normal left ventricular wall thickness. Left ventricular systolic function is mildly reduced. Ejection Fraction =  45-50%.  Global longitudinal strain is - 15%. The tissue Doppler diastolic parameters are indeterminate. There is mild global hypokinesis of the left ventricle. The interventricular septum is asynchronous.  The right ventricle is normal in size and function  . There is a device lead in the right ventricle. TAPSE is 1.8cm. The left atrium is mildly dilated. Right atrial size is normal. The inferior vena cava is normal size with > 50% respirophasic variation, consistent with normal right atrial pressure. There is no Color Doppler evidence for an atrial septal defect. There is mild mitral annular calcification. The mitral valve leaflets appear mildly thickened,. There is no mitral valve  stenosis. There is mild mitral regurgitation. The tricuspid valve is normal in structure. There is mild tricuspid regurgitation. Right ventricular systolic pressure is normal. The aortic valve is mildly thickened. The aortic valve is trileaflet. The aortic valve opens well. No hemodynamically significant valvular aortic stenosis. Mild aortic regurgitation. The pulmonic valve is not well seen, but is grossly normal. Trace pulmonic valvular regurgitation. The aortic root is normal size. The ascending aorta is normal size. There is no pericardial effusion. Prominent anterior fat pad.       Echo 03/14/16: The left ventricle is mildly dilated. There is no thrombus. There is normal left  ventricular wall thickness. Left ventricular systolic function is moderately reduced.Ejection Fraction = 35-40%. The interventricular septum is asynchronous. Basal-mid septal akinesis. Mild mitral and aortic valve regurgitation.    08/10/20 CRT-D interrogation: Normal DDD CRT-D function, no changes made to settings. Histogram shows 0.3% atrial pacing, 98.3% bi- ventricular pacing, good heart rate distribution, no new atrial or ventricular arrhythmias since last visit in October 2020. LV capture threshold 2.75V at 0.19ms. Home remote monitoring will continue. The patient will return to clinic in 1 year.        05/27/19 CRT-D interrogation: Normal dual CRT ICD function, no new atrial arrhythmias, no new ventricular arrhythmia  detections . 27 % atrial pacing, 97 % biventricular pacing, good heart rate distribution. Battery longevity is 7 years, home monitoring will continue.      LHC 2008: without evidence of CAD per patient report      Blood Work:  No results found for: "NA", "K", "CL", "CO2", "BUN", "CREATININE", "GLUCOSE", "CALCIUM", "MG", "BNP", "NT", "HGBA1C", "TSH", "TROPONINI", "TROPOT", "FERRITIN", "IRONSAT" No results found for: "BNP", "NTPROBNP"     Labs 02/19/22 ([place])  Glu 111 mg/dL  ALT: 23 U/L  AST: 26 U/L  BILIRUBIN: 0.4 mg/dL    SODIUM: 161 mmol/L  POTASSIUM: 4.8 mmol/L  BUN 17 mg/dL  Creatinine: 0.6 mg/dL    LDL: 096 mg/dL   Magnesium: 2.0 mEq/L  TSH: 2.7 mIU/L  CRP: 1.3 mg/L    Blood  Hemglobin: 14.1  Wbc: 7.6  A1C: 5.7    Assessment/Plan:    In summary, Rashia Sheff is a 72 y.o. year old with a long-standing history of chronic HFrEF, NICM, LBBB and mild MR who is now s/p CRT-D in 06/2018. She is asymptomatic and her echocardiogram shows partial LVEF recovery s/p CRT from EF 35% to 45-50%, and even better today  at 50-55%. She has had no recent hospitalizations, but has developed NYHA 2-3 symptoms as of summer 2023 with incomplete GDMT optimization although max-dose Entresto.     Nonischemic  cardiomyopathy  HFimpEF  Ms Masterman has had a very favorable LVEF response to the uptitration of BB and ARNI and the deployment of CRT. Her LVEF is the best that it has been in her past  5-year history with [place] at 50-55% today. However she does sound to be somewhat more symptomatic, as outlined in the HPI, with NYHA 2-3 symptoms and mild congestive symptoms, although appears euvolemic on exam. I therefore counseled that we should step up her neurohormonal therapy with the addition of spironolactone 25 mg daily, which she will start today. I would also like her to be on an SGLT2i for improved long-term HF outcomes regardless of EF. However, she does not have prescription insurance, so we are submitting a Industrial/product designer for [place] for her.     Update: Labs 05/04/22 [facility], after the addn of spironolactone 25 mg (still pending grant application for SGLT2i): Na 140, K 4.2, BUN 19, Cr 0.68, eGFR >90.     LBBB, now s/p CRT  Continues device interrogations with [place], no arrhythmias thus far.     Mitral regurgitation  Not clinically significant, no indication for an MR procedural intervention.     Pre-diabetes  SGLT2i addition would be preferable to lessen the long-term progression of insulin resistance.     Health Care Maintenance/Other:  PCP: Pcp No, MD  Referring/primary cardiologist: No ref. provider found   Vaccinations:    Immunization History   Administered Date(s) Administered    Covid-19 Moderna vaccine bivalent (12y+) 06/12/2021    Influenza quadrivalent, ADJUVANTED, 2023-2024 high risk 05/05/2019    Tdap 05/06/2019     Disposition/Follow up  Next clinic visit: 1 year   Next testing: Echo in 1-2 years     On the day of the encounter I spent 45 minutes on the total patient encounter, including time reviewing tests and/or medical records, taking a history, performing a medically appropriate examination and/or evaluation, counseling and educating the patient/family/caregiver,  completing clinical documentation in the electronic or other health record, independently interpreting test results and communicating results to the patient/family/caregiver and care coordination including communication with referring providers(s). Thank you for referring MARIAVICTORIA ZIKA to the [facility] Heart Failure Clinic. We hope our thoughts are in keeping with yours. Please do not hesitate to contact us if there are any questions, concerns, or clinical updates.    Scribed for Dr. Madelaine Etienne by Dorise Hiss, MD 06/11/23, 10:38 AM

## 2023-06-14 ENCOUNTER — Ambulatory Visit: Payer: MEDICARE | Primary: Internal Medicine

## 2023-07-23 MED ORDER — dapagliflozin propanediol (Farxiga) 10 mg
10 | ORAL_TABLET | Freq: Every day | ORAL | 3 refills | Status: AC
Start: 2023-07-23 — End: 2024-07-22

## 2023-07-23 NOTE — Telephone Encounter (Signed)
Patient is requesting a refill for farxiga 10 mg. She is also checking the status for [employer] paperwork.

## 2023-08-07 LAB — HEMOGLOBIN A1C
Estimated Average Glucose mg/dL (INT/EXT): 120 mg/dL (ref 68–123)
HEMOGLOBIN A1C % (INT/EXT): 5.8 % — ABNORMAL HIGH (ref <=5.7)

## 2023-08-12 ENCOUNTER — Inpatient Hospital Stay: Admit: 2023-09-10 | Payer: MEDICARE | Primary: Internal Medicine

## 2023-08-12 DIAGNOSIS — I255 Ischemic cardiomyopathy: Secondary | ICD-10-CM

## 2023-09-10 LAB — CARDIAC DEVICE CHECK CHECK - REMOTE
Atrial Tachy Statistic AT/AF Burden Percent: 0 %
Atrial Tachy Statistic Date Time End: 20241223072508
Atrial Tachy Statistic Date Time Start: 20240923184523
Battery Date Time of Measurements: 20241223072507
Battery RRT Trigger: 2.727
Battery Remaining Longevity: 20 mo
Battery Voltage: 2.92 V
Brady Setting AT Mode Switch Rate: 171 {beats}/min
Brady Setting Lower Rate Limit: 50 {beats}/min
Brady Setting Maximum Sensor Rate: 120 {beats}/min
Brady Setting Maximum Tracking Rate: 120 {beats}/min
Brady Setting PAV Delay Low: 140 ms
Brady Setting SAV Delay Low: 130 ms
Brady Statistic AP VP Percent: 0.23 %
Brady Statistic AP VS Percent: 0.01 %
Brady Statistic AS VP Percent: 98.22 %
Brady Statistic AS VS Percent: 1.54 %
Brady Statistic Date Time End: 20241223072508
Brady Statistic Date Time Start: 20240923184523
Brady Statistic RA Percent Paced: 0.24 %
Brady Statistic RV Percent Paced: 1.6 %
CRT Statistic CRT Percent Paced: 1.57 %
CRT Statistic Date Time End: 20241223072508
CRT Statistic Date Time Start: 20240923184523
CRT Statistic LV Percent Paced: 98.16 %
Capacitor Charge Energy: 18 J
Capacitor Charge Time: 4.334
Capacitor Last Charge Date Time: 20241015074126
Date Time Interrogation Session: 20241223072508
Episode Date Time: 20241208001814
Episode Date Time: 20241214091601
Episode Date Time: 20241215070302
Episode Date Time: 20241219065929
Episode Date Time: 20241220141820
Episode Date Time: 20241220180827
Episode Date Time: 20241220182821
Episode Duration: 10 s
Episode Duration: 12 s
Episode Duration: 139 s
Episode Duration: 20 s
Episode Duration: 26 s
Episode Duration: 5 s
Episode Duration: 5 s
Episode Identifier: 1491
Episode Identifier: 1492
Episode Identifier: 1493
Episode Identifier: 1494
Episode Identifier: 1495
Episode Identifier: 1496
Episode Identifier: 1497
Episode Statistic Recent Count: 0
Episode Statistic Recent Count: 0
Episode Statistic Recent Count: 0
Episode Statistic Recent Count: 0
Episode Statistic Recent Count: 0
Episode Statistic Recent Count: 0
Episode Statistic Recent Count: 0
Episode Statistic Recent Date Time End: 20241223072508
Episode Statistic Recent Date Time End: 20241223072508
Episode Statistic Recent Date Time End: 20241223072508
Episode Statistic Recent Date Time End: 20241223072508
Episode Statistic Recent Date Time End: 20241223072508
Episode Statistic Recent Date Time End: 20241223072508
Episode Statistic Recent Date Time End: 20241223072508
Episode Statistic Recent Date Time Start: 20240923184523
Episode Statistic Recent Date Time Start: 20240923184523
Episode Statistic Recent Date Time Start: 20240923184523
Episode Statistic Recent Date Time Start: 20240923184523
Episode Statistic Recent Date Time Start: 20240923184523
Episode Statistic Recent Date Time Start: 20240923184523
Episode Statistic Recent Date Time Start: 20240923184523
Episode Statistic Total Count: 0
Episode Statistic Total Count: 0
Episode Statistic Total Count: 0
Episode Statistic Total Count: 0
Episode Statistic Total Count: 0
Episode Statistic Total Count: 0
Episode Statistic Total Count: 1
Episode Statistic Total Date Time End: 20241223072508
Episode Statistic Total Date Time End: 20241223072508
Episode Statistic Total Date Time End: 20241223072508
Episode Statistic Total Date Time End: 20241223072508
Episode Statistic Total Date Time End: 20241223072508
Episode Statistic Total Date Time End: 20241223072508
Episode Statistic Total Date Time End: 20241223072508
Implantable Lead Implant Date: 20191119
Implantable Lead Implant Date: 20191119
Implantable Lead Implant Date: 20191119
Implantable Lead Model: 4298
Implantable Lead Model: 5076
Implantable Pulse Generator Implant Date: 20191119
Lead Channel Impedance Value: 1007 Ohm
Lead Channel Impedance Value: 1083 Ohm
Lead Channel Impedance Value: 1235 Ohm
Lead Channel Impedance Value: 1254 Ohm
Lead Channel Impedance Value: 1292 Ohm
Lead Channel Impedance Value: 1349 Ohm
Lead Channel Impedance Value: 308.894
Lead Channel Impedance Value: 322.729
Lead Channel Impedance Value: 332.11 Ohm
Lead Channel Impedance Value: 369.526
Lead Channel Impedance Value: 381.877
Lead Channel Impedance Value: 418 Ohm
Lead Channel Impedance Value: 532 Ohm
Lead Channel Impedance Value: 551 Ohm
Lead Channel Impedance Value: 589 Ohm
Lead Channel Impedance Value: 703 Ohm
Lead Channel Impedance Value: 779 Ohm
Lead Channel Impedance Value: 836 Ohm
Lead Channel Pacing Threshold Amplitude: 0.5 V
Lead Channel Pacing Threshold Amplitude: 0.5 V
Lead Channel Pacing Threshold Amplitude: 0.875 V
Lead Channel Pacing Threshold Pulse Width: 0.4 ms
Lead Channel Pacing Threshold Pulse Width: 0.4 ms
Lead Channel Pacing Threshold Pulse Width: 0.4 ms
Lead Channel Sensing Intrinsic Amplitude: 2.25 mV
Lead Channel Sensing Intrinsic Amplitude: 2.25 mV
Lead Channel Sensing Intrinsic Amplitude: 20.25 mV
Lead Channel Sensing Intrinsic Amplitude: 20.25 mV
Lead Channel Setting Pacing Amplitude: 1.5 V
Lead Channel Setting Pacing Amplitude: 1.5 V
Lead Channel Setting Pacing Amplitude: 2 V
Lead Channel Setting Pacing Pulse Width: 0.4 ms
Lead Channel Setting Pacing Pulse Width: 0.4 ms
Lead Channel Setting Pacing Pulse Width: 0.7 ms
Lead Channel Setting Sensing Sensitivity: 0.3 mV
Lead Channel Setting Sensing Sensitivity: 0.45 mV
Therapy Statistic Recent ATP Delivered: 0
Therapy Statistic Recent Date Time End: 20241223072508
Therapy Statistic Recent Date Time Start: 20240923184523
Therapy Statistic Recent Shocks Aborted: 0
Therapy Statistic Recent Shocks Delivered: 0
Therapy Statistic Total  Date Time End: 20241223072508
Therapy Statistic Total ATP Delivered: 0
Therapy Statistic Total Shocks Aborted: 0
Therapy Statistic Total Shocks Delivered: 0
Zone Setting Detection Beats Denominator: 32 {beats}
Zone Setting Detection Beats Numerator: 24 {beats}
Zone Setting Detection Interval: 300 ms
Zone Setting Detection Interval: 350 ms
Zone Setting Detection Interval: 350 ms
Zone Setting Detection Interval: 360 ms

## 2023-11-11 ENCOUNTER — Inpatient Hospital Stay: Admit: 2023-11-21 | Primary: Internal Medicine

## 2023-11-11 DIAGNOSIS — I429 Cardiomyopathy, unspecified: Secondary | ICD-10-CM

## 2023-11-22 LAB — CARDIAC DEVICE CHECK CHECK - REMOTE
Atrial Tachy Statistic AT/AF Burden Percent: 0 %
Atrial Tachy Statistic Date Time End: 20250324022602
Atrial Tachy Statistic Date Time Start: 20241223072508
Battery Date Time of Measurements: 20250324022600
Battery RRT Trigger: 2.727
Battery Remaining Longevity: 22 mo
Battery Voltage: 2.91 V
Brady Setting AT Mode Switch Rate: 171 {beats}/min
Brady Setting Lower Rate Limit: 50 {beats}/min
Brady Setting Maximum Sensor Rate: 120 {beats}/min
Brady Setting Maximum Tracking Rate: 120 {beats}/min
Brady Setting PAV Delay Low: 150 ms
Brady Setting SAV Delay Low: 130 ms
Brady Statistic AP VP Percent: 0.38 %
Brady Statistic AP VS Percent: 0.01 %
Brady Statistic AS VP Percent: 98.01 %
Brady Statistic AS VS Percent: 1.59 %
Brady Statistic Date Time End: 20250324022602
Brady Statistic Date Time Start: 20241223072508
Brady Statistic RA Percent Paced: 0.39 %
Brady Statistic RV Percent Paced: 1.86 %
CRT Statistic CRT Percent Paced: 1.82 %
CRT Statistic Date Time End: 20250324022602
CRT Statistic Date Time Start: 20241223072508
CRT Statistic LV Percent Paced: 98.15 %
Capacitor Charge Energy: 18 J
Capacitor Charge Time: 4.364
Capacitor Last Charge Date Time: 20250115022547
Date Time Interrogation Session: 20250324022602
Episode Date Time: 20250130101736
Episode Date Time: 20250321102441
Episode Date Time: 20250321102608
Episode Date Time: 20250321102629
Episode Date Time: 20250321102650
Episode Date Time: 20250321102718
Episode Date Time: 20250321102735
Episode Date Time: 20250321192340
Episode Duration: 10 s
Episode Duration: 12 s
Episode Duration: 15 s
Episode Duration: 17 s
Episode Duration: 2062 s
Episode Duration: 5 s
Episode Duration: 5 s
Episode Duration: 7 s
Episode Identifier: 1548
Episode Identifier: 1636
Episode Identifier: 1637
Episode Identifier: 1638
Episode Identifier: 1639
Episode Identifier: 1640
Episode Identifier: 1641
Episode Identifier: 1642
Episode Statistic Recent Count: 0
Episode Statistic Recent Count: 0
Episode Statistic Recent Count: 0
Episode Statistic Recent Count: 0
Episode Statistic Recent Count: 0
Episode Statistic Recent Count: 0
Episode Statistic Recent Count: 0
Episode Statistic Recent Date Time End: 20250324022602
Episode Statistic Recent Date Time End: 20250324022602
Episode Statistic Recent Date Time End: 20250324022602
Episode Statistic Recent Date Time End: 20250324022602
Episode Statistic Recent Date Time End: 20250324022602
Episode Statistic Recent Date Time End: 20250324022602
Episode Statistic Recent Date Time End: 20250324022602
Episode Statistic Recent Date Time Start: 20241223072508
Episode Statistic Recent Date Time Start: 20241223072508
Episode Statistic Recent Date Time Start: 20241223072508
Episode Statistic Recent Date Time Start: 20241223072508
Episode Statistic Recent Date Time Start: 20241223072508
Episode Statistic Recent Date Time Start: 20241223072508
Episode Statistic Recent Date Time Start: 20241223072508
Episode Statistic Total Count: 0
Episode Statistic Total Count: 0
Episode Statistic Total Count: 0
Episode Statistic Total Count: 0
Episode Statistic Total Count: 0
Episode Statistic Total Count: 0
Episode Statistic Total Count: 1
Episode Statistic Total Date Time End: 20250324022602
Episode Statistic Total Date Time End: 20250324022602
Episode Statistic Total Date Time End: 20250324022602
Episode Statistic Total Date Time End: 20250324022602
Episode Statistic Total Date Time End: 20250324022602
Episode Statistic Total Date Time End: 20250324022602
Episode Statistic Total Date Time End: 20250324022602
Implantable Lead Implant Date: 20191119
Implantable Lead Implant Date: 20191119
Implantable Lead Implant Date: 20191119
Implantable Lead Model: 4298
Implantable Lead Model: 5076
Implantable Pulse Generator Implant Date: 20191119
Lead Channel Impedance Value: 1083 Ohm
Lead Channel Impedance Value: 1178 Ohm
Lead Channel Impedance Value: 1178 Ohm
Lead Channel Impedance Value: 1254 Ohm
Lead Channel Impedance Value: 1254 Ohm
Lead Channel Impedance Value: 285.934
Lead Channel Impedance Value: 309.309
Lead Channel Impedance Value: 309.309
Lead Channel Impedance Value: 353.147
Lead Channel Impedance Value: 353.147
Lead Channel Impedance Value: 418 Ohm
Lead Channel Impedance Value: 475 Ohm
Lead Channel Impedance Value: 513 Ohm
Lead Channel Impedance Value: 532 Ohm
Lead Channel Impedance Value: 646 Ohm
Lead Channel Impedance Value: 779 Ohm
Lead Channel Impedance Value: 779 Ohm
Lead Channel Impedance Value: 950 Ohm
Lead Channel Pacing Threshold Amplitude: 0.625 V
Lead Channel Pacing Threshold Amplitude: 0.625 V
Lead Channel Pacing Threshold Amplitude: 0.875 V
Lead Channel Pacing Threshold Pulse Width: 0.4 ms
Lead Channel Pacing Threshold Pulse Width: 0.4 ms
Lead Channel Pacing Threshold Pulse Width: 0.4 ms
Lead Channel Sensing Intrinsic Amplitude: 18.125 mV
Lead Channel Sensing Intrinsic Amplitude: 18.125 mV
Lead Channel Sensing Intrinsic Amplitude: 2 mV
Lead Channel Sensing Intrinsic Amplitude: 2 mV
Lead Channel Setting Pacing Amplitude: 1.5 V
Lead Channel Setting Pacing Amplitude: 1.5 V
Lead Channel Setting Pacing Amplitude: 2 V
Lead Channel Setting Pacing Pulse Width: 0.4 ms
Lead Channel Setting Pacing Pulse Width: 0.4 ms
Lead Channel Setting Pacing Pulse Width: 0.7 ms
Lead Channel Setting Sensing Sensitivity: 0.3 mV
Lead Channel Setting Sensing Sensitivity: 0.45 mV
Therapy Statistic Recent ATP Delivered: 0
Therapy Statistic Recent Date Time End: 20250324022602
Therapy Statistic Recent Date Time Start: 20241223072508
Therapy Statistic Recent Shocks Aborted: 0
Therapy Statistic Recent Shocks Delivered: 0
Therapy Statistic Total  Date Time End: 20250324022602
Therapy Statistic Total ATP Delivered: 0
Therapy Statistic Total Shocks Aborted: 0
Therapy Statistic Total Shocks Delivered: 0
Zone Setting Detection Beats Denominator: 32 {beats}
Zone Setting Detection Beats Numerator: 24 {beats}
Zone Setting Detection Interval: 300 ms
Zone Setting Detection Interval: 350 ms
Zone Setting Detection Interval: 350 ms
Zone Setting Detection Interval: 360 ms

## 2024-02-10 ENCOUNTER — Inpatient Hospital Stay: Admit: 2024-02-12 | Payer: MEDICARE

## 2024-02-10 DIAGNOSIS — I472 Ventricular tachycardia, unspecified (Multi-HCC): Secondary | ICD-10-CM

## 2024-02-10 DIAGNOSIS — I429 Cardiomyopathy, unspecified: Secondary | ICD-10-CM

## 2024-02-15 LAB — CARDIAC DEVICE CHECK CHECK - REMOTE
Atrial Tachy Statistic AT/AF Burden Percent: 0 %
Atrial Tachy Statistic Date Time End: 20250623012202
Atrial Tachy Statistic Date Time Start: 20250324022602
Battery Date Time of Measurements: 20250623012200
Battery RRT Trigger: 2.727
Battery Remaining Longevity: 20 mo
Battery Voltage: 2.91 V
Brady Setting AT Mode Switch Rate: 171 {beats}/min
Brady Setting Lower Rate Limit: 50 {beats}/min
Brady Setting Maximum Sensor Rate: 120 {beats}/min
Brady Setting Maximum Tracking Rate: 120 {beats}/min
Brady Setting PAV Delay Low: 160 ms
Brady Setting SAV Delay Low: 130 ms
Brady Statistic AP VP Percent: 0.11 %
Brady Statistic AP VS Percent: 0.01 %
Brady Statistic AS VP Percent: 98.41 %
Brady Statistic AS VS Percent: 1.47 %
Brady Statistic Date Time End: 20250623012202
Brady Statistic Date Time Start: 20250324022602
Brady Statistic RA Percent Paced: 0.11 %
Brady Statistic RV Percent Paced: 1.37 %
CRT Statistic CRT Percent Paced: 1.35 %
CRT Statistic Date Time End: 20250623012202
CRT Statistic Date Time Start: 20250324022602
CRT Statistic LV Percent Paced: 98.36 %
Capacitor Charge Energy: 18 J
Capacitor Charge Time: 4.394
Capacitor Last Charge Date Time: 20250416211007
Date Time Interrogation Session: 20250623012202
Episode Date Time: 20250504111627
Episode Date Time: 20250511090338
Episode Date Time: 20250516114448
Episode Date Time: 20250516114501
Episode Date Time: 20250516114800
Episode Date Time: 20250516114849
Episode Date Time: 20250523064605
Episode Duration: 128 s
Episode Duration: 31 s
Episode Duration: 45 s
Episode Duration: 49 s
Episode Duration: 5 s
Episode Duration: 7 s
Episode Duration: 8 s
Episode Identifier: 1680
Episode Identifier: 1681
Episode Identifier: 1682
Episode Identifier: 1683
Episode Identifier: 1684
Episode Identifier: 1685
Episode Identifier: 1686
Episode Statistic Recent Count: 0
Episode Statistic Recent Count: 0
Episode Statistic Recent Count: 0
Episode Statistic Recent Count: 0
Episode Statistic Recent Count: 0
Episode Statistic Recent Count: 0
Episode Statistic Recent Count: 0
Episode Statistic Recent Date Time End: 20250623012202
Episode Statistic Recent Date Time End: 20250623012202
Episode Statistic Recent Date Time End: 20250623012202
Episode Statistic Recent Date Time End: 20250623012202
Episode Statistic Recent Date Time End: 20250623012202
Episode Statistic Recent Date Time End: 20250623012202
Episode Statistic Recent Date Time End: 20250623012202
Episode Statistic Recent Date Time Start: 20250324022602
Episode Statistic Recent Date Time Start: 20250324022602
Episode Statistic Recent Date Time Start: 20250324022602
Episode Statistic Recent Date Time Start: 20250324022602
Episode Statistic Recent Date Time Start: 20250324022602
Episode Statistic Recent Date Time Start: 20250324022602
Episode Statistic Recent Date Time Start: 20250324022602
Episode Statistic Total Count: 0
Episode Statistic Total Count: 0
Episode Statistic Total Count: 0
Episode Statistic Total Count: 0
Episode Statistic Total Count: 0
Episode Statistic Total Count: 0
Episode Statistic Total Count: 1
Episode Statistic Total Date Time End: 20250623012202
Episode Statistic Total Date Time End: 20250623012202
Episode Statistic Total Date Time End: 20250623012202
Episode Statistic Total Date Time End: 20250623012202
Episode Statistic Total Date Time End: 20250623012202
Episode Statistic Total Date Time End: 20250623012202
Episode Statistic Total Date Time End: 20250623012202
Implantable Lead Implant Date: 20191119
Implantable Lead Implant Date: 20191119
Implantable Lead Implant Date: 20191119
Implantable Lead Model: 4298
Implantable Lead Model: 5076
Implantable Pulse Generator Implant Date: 20191119
Lead Channel Impedance Value: 1064 Ohm
Lead Channel Impedance Value: 1083 Ohm
Lead Channel Impedance Value: 1254 Ohm
Lead Channel Impedance Value: 1254 Ohm
Lead Channel Impedance Value: 1292 Ohm
Lead Channel Impedance Value: 1292 Ohm
Lead Channel Impedance Value: 285.934
Lead Channel Impedance Value: 317.915
Lead Channel Impedance Value: 323.26 Ohm
Lead Channel Impedance Value: 364.41 Ohm
Lead Channel Impedance Value: 371.45 Ohm
Lead Channel Impedance Value: 456 Ohm
Lead Channel Impedance Value: 513 Ohm
Lead Channel Impedance Value: 513 Ohm
Lead Channel Impedance Value: 589 Ohm
Lead Channel Impedance Value: 646 Ohm
Lead Channel Impedance Value: 836 Ohm
Lead Channel Impedance Value: 874 Ohm
Lead Channel Pacing Threshold Amplitude: 0.5 V
Lead Channel Pacing Threshold Amplitude: 0.5 V
Lead Channel Pacing Threshold Amplitude: 0.875 V
Lead Channel Pacing Threshold Pulse Width: 0.4 ms
Lead Channel Pacing Threshold Pulse Width: 0.4 ms
Lead Channel Pacing Threshold Pulse Width: 0.4 ms
Lead Channel Sensing Intrinsic Amplitude: 16.5 mV
Lead Channel Sensing Intrinsic Amplitude: 16.5 mV
Lead Channel Sensing Intrinsic Amplitude: 2.375 mV
Lead Channel Sensing Intrinsic Amplitude: 2.375 mV
Lead Channel Setting Pacing Amplitude: 1.5 V
Lead Channel Setting Pacing Amplitude: 1.5 V
Lead Channel Setting Pacing Amplitude: 2 V
Lead Channel Setting Pacing Pulse Width: 0.4 ms
Lead Channel Setting Pacing Pulse Width: 0.4 ms
Lead Channel Setting Pacing Pulse Width: 0.7 ms
Lead Channel Setting Sensing Sensitivity: 0.3 mV
Lead Channel Setting Sensing Sensitivity: 0.45 mV
Therapy Statistic Recent ATP Delivered: 0
Therapy Statistic Recent Date Time End: 20250623012202
Therapy Statistic Recent Date Time Start: 20250324022602
Therapy Statistic Recent Shocks Aborted: 0
Therapy Statistic Recent Shocks Delivered: 0
Therapy Statistic Total  Date Time End: 20250623012202
Therapy Statistic Total ATP Delivered: 0
Therapy Statistic Total Shocks Aborted: 0
Therapy Statistic Total Shocks Delivered: 0
Zone Setting Detection Beats Denominator: 32 {beats}
Zone Setting Detection Beats Numerator: 24 {beats}
Zone Setting Detection Interval: 300 ms
Zone Setting Detection Interval: 350 ms
Zone Setting Detection Interval: 350 ms
Zone Setting Detection Interval: 360 ms

## 2024-02-26 NOTE — Progress Notes (Signed)
 ECHO - annually - next Cardiology Office Visit 06/12/2024 Dr. Gulati

## 2024-02-26 NOTE — Telephone Encounter (Signed)
 patient called in to request an echo be ordered and scheduled for the same day they see dr sherman on 06/12/24    please assist  Callback:  [phone]

## 2024-04-22 MED ORDER — Farxiga 10 mg
10 | ORAL_TABLET | Freq: Every day | ORAL | 11 refills | 30.00000 days | Status: AC
Start: 2024-04-22 — End: 2025-04-22

## 2024-05-02 LAB — HEMOGLOBIN A1C
Estimated Average Glucose mg/dL (INT/EXT): 123 mg/dL (ref 68–123)
HEMOGLOBIN A1C % (INT/EXT): 5.9 % — ABNORMAL HIGH (ref <=5.7)

## 2024-05-11 ENCOUNTER — Ambulatory Visit: Admit: 2024-05-11 | Discharge: 2024-05-11 | Payer: MEDICARE | Attending: Cardiovascular Disease

## 2024-05-11 ENCOUNTER — Inpatient Hospital Stay: Admit: 2024-05-11 | Payer: MEDICARE

## 2024-05-11 DIAGNOSIS — I428 Other cardiomyopathies: Principal | ICD-10-CM

## 2024-05-11 DIAGNOSIS — Z4502 Encounter for adjustment and management of automatic implantable cardiac defibrillator: Principal | ICD-10-CM

## 2024-05-11 NOTE — Progress Notes (Signed)
 Reason for Visit: nicmp, CRT-D follow up    HPI:   Stephanie [name] is a 73 y.o. year old female  who has had a long-standing history of exertional dyspnea and chest discomfort. She splits her time between her home in [place], [place] and here in the [place]  where her son lives in [place]  and her daughter in [place] .  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 Cardiac catheterization in 2008 failed to demonstrate any occlusive disease. At some point she was diagnosed with mitral regurgitation and was in fact referred for mitral valve repair/replacement before the cardiothoracic surgeon determined that her mitral regurgitation was functional due to her underlying cardiomyopathy. She was started on atenolol and enalapril and she continued following with her cardiologist periodically. In 2017 she had an LVEF 35-40% with a mildly dilated left ventricle and mild mitral regurgitation.  She was referred for consideration of cardiac resynchronization therapy in July 2019 after an echocardiogram showed an LVEF of 35%.  She underwent uneventful CRT-D implant on 07/08/2018.  In October 2020 her echocardiogram showed an LVEF of 45 to 50%. Echocardiogram in December 2021 showed an LVEF 45 to 50%.    Due to diaphragmatic pacing, at her visit on 05/01/2021, LV capture management was turned off and the amplitude was set at 2.5 V@0 .4 ms.  Pulse width was also increased 2.7 ms had a capture threshold of 1.75 V to minimize any diaphragmatic pacing.  The chest x-ray failed to demonstrate any migration of her LV lead. Her most recent Echo 06/12/23 showed LVEF 50-55%.     Stephanie [name] presents today for follow up.   She reports doing well from a cardiovascular standpoint.   She denies palpitations, chest pain, presyncope or syncope.  She is able to walk without SOB but is limited by her knee pain/osteoarthritis.  Her device was interrogated today which shows DDD  CRTD shows normal function (50- 120 bpm) with good heart rate distribution.  The battery has 18 months til expected ERI.  The lead values are stable. 98% LV pacing .  No arrhythmia detected episodes.  PVC counts are flat.      Past Medical History:   Diagnosis Date    Gastritis     Graves disease 2010    s/p radioactive ablation    Hyperlipidemia     Hypertension     LBBB (left bundle branch block) 06/2018    now s/p CRT-D    Mild mitral regurgitation     Nonischemic cardiomyopathy (Multi-HCC)     chronic HFrEf    Osteoarthritis           Past Surgical History:   Procedure Laterality Date    BREAST LUMPECTOMY          Medications:   Current Outpatient Medications   Medication Instructions    aspirin 81 mg, Daily RT    atorvastatin (LIPITOR) 10 mg, Daily RT    bisoprolol (ZEBETA) 2.5 mg, Daily RT    Farxiga  10 mg, oral, Daily    furosemide (LASIX) 20 mg, Daily RT    levothyroxine (SYNTHROID) 100 mcg, Daily RT    omeprazole (PRILOSEC) 10 mg, Daily RT    sacubitriL -valsartan  (Entresto ) 97-103 mg tablet 1 tablet, oral, 2 times daily    spironolactone  (ALDACTONE ) 25 mg, oral, Daily       Allergies:  Patient has no known allergies.      reports that  she has never smoked. She has never used smokeless tobacco. She reports that she does not drink alcohol and does not use drugs.     Family History   Problem Relation Name Age of Onset    Coronary artery disease Mother      Heart disease Father      No Known Problems Daughter      No Known Problems Son      No Known Problems Son         ROS  Pertinent positives listed in HPI. Comprehensive ROS otherwise negative.    Physical Exam:     Gen: No acute distress, comfortable, alert  Lungs: Clear to auscultation bilaterally  CV: regular rate and rhythm, no murmurs, rubs, or gallops  Ext: no LE edema    Visit Vitals  BP 118/62 (BP Location: Left arm, Patient Position: Sitting, BP Cuff Size: Adult)   Pulse 64   Ht 1.549 m   Wt 67 kg   BMI 27.94 kg/m   BSA 1.7 m     ECG:  100% V paced  @ 64 bpm     Echocardiogram:  06/12/23  Left Ventricle  The left ventricle is normal in size. There is normal left ventricular wall thickness. Ejection  Fraction = 50-55%. A full diastolic function study was completed with echocardiographic findings of  Grade I diastolic dysfunction. Left ventricular systolic function is low normal. Regional wall  motion abnormalities cannot be excluded due to limited visualization.     Right Ventricle  The right ventricle is normal size. There is a device lead in the right ventricle. The right  ventricular systolic function is normal.     Atria  The left atrium is mildly dilated. Right atrial size is normal. The inferior vena cava is normal  size with > 50% respirophasic variation, consistent with normal right atrial pressure.     Mitral Valve  The mitral valve is grossly normal. There is no mitral valve stenosis. There is mild mitral  regurgitation.     Tricuspid Valve  The tricuspid valve is not well visualized. There is no tricuspid stenosis. There is mild tricuspid  regurgitation. Right ventricular systolic pressure is normal.     Aortic Valve  The aortic valve is trileaflet. The aortic valve opens well. No hemodynamically significant valvular  aortic stenosis. Mild aortic regurgitation.     Pulmonic Valve  The pulmonic valve is not well visualized. There is no pulmonic valvular stenosis. Trace pulmonic  valvular regurgitation.     Great Vessels  The aortic root is normal size. The ascending aorta is not well visualized.     Pericardium/Pleural  There is no pericardial effusion.     Interpretation Summary  The study was technically difficult. I have personally reviewed the study and confirm the fellow's  findings. Please see full report for findings. Compared to prior study, there is no significant  change.    Echocardiogram: 04/25/22  Left Ventricle  The left ventricle is normal in size. There is normal left ventricular wall thickness. Ejection  Fraction = 50-55%. Left  ventricular systolic function is normal. A full diastolic function study was  completed with echocardiographic findings of Grade I diastolic dysfunction. No regional wall motion  abnormalities noted. The interventricular septum is asynchronous.     Right Ventricle  The right ventricle is grossly normal size. There is a device lead in the right ventricle. The right  ventricular systolic function is normal.     Atria  The left atrium is moderately dilated. Right atrial size is normal. The inferior vena cava is normal  size with > 50% respirophasic variation, consistent with normal right atrial pressure. There is no  Color Doppler evidence for an atrial septal defect.     Mitral Valve  There is mild mitral annular calcification. There is no mitral valve stenosis. There is mild mitral  regurgitation.     Tricuspid Valve  Tricuspid leaflets are thickened. There is no tricuspid stenosis. There is mild tricuspid  regurgitation. Right ventricular systolic pressure is normal.     Aortic Valve  The aortic valve is trileaflet. The aortic valve opens well. No hemodynamically significant valvular  aortic stenosis. Mild aortic regurgitation.     Pulmonic Valve  The pulmonic valve is not well visualized. There is no pulmonic valvular stenosis. Trace pulmonic  valvular regurgitation.     Great Vessels  The aortic root is normal size. The ascending aorta is normal size. No Doppler or imaging evidence  of an aortic coarctation.     Pericardium/Pleural  There is no pericardial effusion. Prominent epicardial fat pad.     Interpretation Summary  I have personally reviewed the study and confirm the fellow's findings. Please see full report for  findings.  Ejection Fraction = 50-55%. Left ventricular systolic function is normal.  No regional wall motion abnormalities noted.  The right ventricle is grossly normal size. The right ventricular systolic function is normal.  Compared to prior study from 08/10/20, there is improvement in LV  systolic function.     (08/10/2020): Normal-sized LV cavity with normal wall thickness and mildly reduced LV systolic function, LVEF 45 to 49%.     CXR: (05/01/21): No acute abnormality    DEVICE : The DDD CRTD shows normal function (50- 120 bpm) with good heart rate distribution.  The battery has 18 months til expected ERI.  The lead values are stable. 98% biv pacing .  No arrhythmia detected episodes.  PVC counts are flat.  Remote monitoring will continue.      Assessment and Plan:  Problem List Items Addressed This Visit          Circulatory    Non-ischemic cardiomyopathy (Multi-HCC) - Primary    Current Assessment & Plan   Ms. Magowan feels well with no complaints. NYHA class I symptoms, euvolemic on exam.  Last echo 06/12/23 showed LVEF 50-55%. She is scheduled to have repeat echo next month and will follow up Dr. Sherman.              Relevant Orders    ECG 12 lead [[facility]: [facility]]    Presence of cardiac resynchronization therapy defibrillator (CRT-D)    Current Assessment & Plan   The DDD CRTD shows normal function (50- 120 bpm) with good heart rate distribution.  The battery has 18 months til expected ERI.  The lead values are stable. 98% LV pacing .  No arrhythmia detected episodes.  PVC counts are flat.  The battery has 18 months til expected ERI.  She will continue remote monitoring.                Medication changes made this visit: NONE     Next recommended follow-up Visit:  1 year with device check prior to visit    On the day of the encounter I spent 35 minutes on the total patient encounter, including time reviewing tests and/or medical records, taking a history, performing a medically appropriate examination and/or evaluation, counseling  and educating the patient/family/caregiver, completing clinical documentation in the electronic or other health record, independently interpreting test results and communicating results to the patient/family/caregiver and care coordination including communication with  referring physician(s).     Mekesha Solomon ANN Jarian Longoria, NP

## 2024-05-11 NOTE — Assessment & Plan Note (Signed)
 Ms. Kreiter feels well with no complaints. NYHA class I symptoms, euvolemic on exam.  Last echo 06/12/23 showed LVEF 50-55%. She is scheduled to have repeat echo next month and will follow up Dr. Sherman.

## 2024-05-11 NOTE — Assessment & Plan Note (Signed)
 The DDD CRTD shows normal function (50- 120 bpm) with good heart rate distribution.  The battery has 18 months til expected ERI.  The lead values are stable. 98% LV pacing .  No arrhythmia detected episodes.  PVC counts are flat.  The battery has 18 months til expected ERI.  She will continue remote monitoring.

## 2024-05-14 LAB — CARDIAC DEVICE CHECK - IN CLINIC
Atrial Tachy Statistic AT/AF Burden Percent: 0 %
Atrial Tachy Statistic Date Time End: 20250922164748
Atrial Tachy Statistic Date Time Start: 20240923184523
Battery Date Time of Measurements: 20250922165001
Battery RRT Trigger: 2.727
Battery Remaining Longevity: 18 mo
Battery Voltage: 2.9 V
Brady Setting AT Mode Switch Rate: 171 {beats}/min
Brady Setting Lower Rate Limit: 50 {beats}/min
Brady Setting Maximum Sensor Rate: 120 {beats}/min
Brady Setting Maximum Tracking Rate: 120 {beats}/min
Brady Setting PAV Delay Low: 150 ms
Brady Setting SAV Delay Low: 130 ms
Brady Statistic AP VP Percent: 0.39 %
Brady Statistic AP VS Percent: 0.01 %
Brady Statistic AS VP Percent: 98.07 %
Brady Statistic AS VS Percent: 1.53 %
Brady Statistic Date Time End: 20250922164748
Brady Statistic Date Time Start: 20240923184523
Brady Statistic RA Percent Paced: 0.4 %
Brady Statistic RV Percent Paced: 1.47 %
CRT Statistic CRT Percent Paced: 98.21 %
CRT Statistic Date Time End: 20250922164748
CRT Statistic Date Time Start: 20240923184523
CRT Statistic LV Percent Paced: 98.21 %
Capacitor Charge Energy: 18 J
Capacitor Charge Time: 4.434
Capacitor Last Charge Date Time: 20250717155427
Date Time Interrogation Session: 20250922164748
Episode Date Time: 20250919211802
Episode Date Time: 20250919211915
Episode Date Time: 20250919211932
Episode Date Time: 20250919211955
Episode Date Time: 20250919212015
Episode Date Time: 20250919212204
Episode Date Time: 20250919212457
Episode Duration: 11 s
Episode Duration: 17 s
Episode Duration: 33 s
Episode Duration: 4 s
Episode Duration: 66 s
Episode Duration: 67 s
Episode Duration: 9 s
Episode Identifier: 1696
Episode Identifier: 1697
Episode Identifier: 1698
Episode Identifier: 1699
Episode Identifier: 1700
Episode Identifier: 1701
Episode Identifier: 1702
Episode Statistic Recent Count: 0
Episode Statistic Recent Count: 0
Episode Statistic Recent Count: 0
Episode Statistic Recent Count: 0
Episode Statistic Recent Count: 0
Episode Statistic Recent Count: 0
Episode Statistic Recent Count: 0
Episode Statistic Recent Date Time End: 20250922164748
Episode Statistic Recent Date Time End: 20250922164748
Episode Statistic Recent Date Time End: 20250922164748
Episode Statistic Recent Date Time End: 20250922164748
Episode Statistic Recent Date Time End: 20250922164748
Episode Statistic Recent Date Time End: 20250922164748
Episode Statistic Recent Date Time End: 20250922164748
Episode Statistic Recent Date Time Start: 20240923184523
Episode Statistic Recent Date Time Start: 20240923184523
Episode Statistic Recent Date Time Start: 20240923184523
Episode Statistic Recent Date Time Start: 20240923184523
Episode Statistic Recent Date Time Start: 20240923184523
Episode Statistic Recent Date Time Start: 20240923184523
Episode Statistic Recent Date Time Start: 20240923184523
Episode Statistic Total Count: 0
Episode Statistic Total Count: 0
Episode Statistic Total Count: 0
Episode Statistic Total Count: 0
Episode Statistic Total Count: 0
Episode Statistic Total Count: 0
Episode Statistic Total Count: 1
Episode Statistic Total Date Time End: 20250922164748
Episode Statistic Total Date Time End: 20250922164748
Episode Statistic Total Date Time End: 20250922164748
Episode Statistic Total Date Time End: 20250922164748
Episode Statistic Total Date Time End: 20250922164748
Episode Statistic Total Date Time End: 20250922164748
Episode Statistic Total Date Time End: 20250922164748
Implantable Lead Implant Date: 20191119
Implantable Lead Implant Date: 20191119
Implantable Lead Implant Date: 20191119
Implantable Lead Model: 4298
Implantable Lead Model: 5076
Implantable Pulse Generator Implant Date: 20191119
Lead Channel Impedance Value: 1026 Ohm
Lead Channel Impedance Value: 1140 Ohm
Lead Channel Impedance Value: 1197 Ohm
Lead Channel Impedance Value: 1254 Ohm
Lead Channel Impedance Value: 1292 Ohm
Lead Channel Impedance Value: 1311 Ohm
Lead Channel Impedance Value: 308.894
Lead Channel Impedance Value: 322.729
Lead Channel Impedance Value: 332.11 Ohm
Lead Channel Impedance Value: 369.526
Lead Channel Impedance Value: 381.877
Lead Channel Impedance Value: 456 Ohm
Lead Channel Impedance Value: 513 Ohm
Lead Channel Impedance Value: 551 Ohm
Lead Channel Impedance Value: 589 Ohm
Lead Channel Impedance Value: 703 Ohm
Lead Channel Impedance Value: 779 Ohm
Lead Channel Impedance Value: 836 Ohm
Lead Channel Pacing Threshold Amplitude: 0.5 V
Lead Channel Pacing Threshold Amplitude: 0.625 V
Lead Channel Pacing Threshold Amplitude: 0.875 V
Lead Channel Pacing Threshold Pulse Width: 0.4 ms
Lead Channel Pacing Threshold Pulse Width: 0.4 ms
Lead Channel Pacing Threshold Pulse Width: 0.4 ms
Lead Channel Sensing Intrinsic Amplitude: 16.5 mV
Lead Channel Sensing Intrinsic Amplitude: 16.875 mV
Lead Channel Sensing Intrinsic Amplitude: 2.25 mV
Lead Channel Sensing Intrinsic Amplitude: 2.25 mV
Lead Channel Setting Pacing Amplitude: 1.5 V
Lead Channel Setting Pacing Amplitude: 1.5 V
Lead Channel Setting Pacing Amplitude: 2 V
Lead Channel Setting Pacing Pulse Width: 0.4 ms
Lead Channel Setting Pacing Pulse Width: 0.4 ms
Lead Channel Setting Pacing Pulse Width: 0.7 ms
Lead Channel Setting Sensing Sensitivity: 0.3 mV
Lead Channel Setting Sensing Sensitivity: 0.45 mV
Therapy Statistic Recent ATP Delivered: 0
Therapy Statistic Recent Date Time End: 20250922164748
Therapy Statistic Recent Date Time Start: 20240923184523
Therapy Statistic Recent Shocks Aborted: 0
Therapy Statistic Recent Shocks Delivered: 0
Therapy Statistic Total  Date Time End: 20250922164748
Therapy Statistic Total ATP Delivered: 0
Therapy Statistic Total Shocks Aborted: 0
Therapy Statistic Total Shocks Delivered: 0
Zone Setting Detection Beats Denominator: 32 {beats}
Zone Setting Detection Beats Numerator: 24 {beats}
Zone Setting Detection Interval: 300 ms
Zone Setting Detection Interval: 350 ms
Zone Setting Detection Interval: 350 ms
Zone Setting Detection Interval: 360 ms

## 2024-06-12 ENCOUNTER — Inpatient Hospital Stay: Admit: 2024-06-12 | Payer: MEDICARE

## 2024-06-12 ENCOUNTER — Ambulatory Visit: Admit: 2024-06-12 | Discharge: 2024-06-12 | Payer: MEDICARE | Attending: Cardiovascular Disease

## 2024-06-12 DIAGNOSIS — I429 Cardiomyopathy, unspecified: Principal | ICD-10-CM

## 2024-06-12 DIAGNOSIS — I428 Other cardiomyopathies: Principal | ICD-10-CM

## 2024-06-12 NOTE — Progress Notes (Signed)
 Heart Failure Clinic Note      Patient:  Stephanie [name]   DOB:  1950/12/30   MRN:     [id number] Age:   73 y.o.   Encounter Date: 06/12/2024 Sex:    female     Reason for Visit: 73 yr Follow-up    Associated Providers: PCP: WOLM HARDEN BROADEN, MD    It was a pleasure to see Stephanie [name] in the Heart Failure Clinic at [facility].  Stephanie [name] is a 73 y.o. year old female with a PMH of NICM, LBBB s/p CRT-D 2019 with subsequent normalization of LV function, HTN, HLD and graves disease sp radioactive ablation who presents for continued management .    First presented with dyspnea in the early 1990s. In 2008 she had LHC that did not show evidence of coronary artery disease. TTE showed mild to moderate mitral valve regurgitation and LVEDD at 5.6 cm. She was started on atenolol and enalapril and she continued following with her cardiologist periodically. In 2010 she was diagnosed with Graves disease and she received radioactive iodine ablation.    In 2016, she had progressive symptoms and a TEE suggested severe MR. However on surgical review, the MR was recognized to be less severe and functional in nature. Her LV was more dilated compared to 2008 (6.1 cm) and her EF had dropped to 40%. The patient was started on bisoprolol, entresto  and lasix about three months.    In 2017 she had an LVEF 35-40% with a mildly dilated left ventricle and mild mitral regurgitation. She underwent uneventful CRT-D implant on 07/08/2018.  In October 2020 her echocardiogram showed an LVEF of 45 to 50%, and this has been stable for several years.    Since that time, she has been stable from a HF standpoint.    Today 06/12/2024, she reports feeling well from a cardiac standpoint. Some dizziness with turning her head quickly. No orthopnea or PND. No LE edema. She has occasional lightheadedness but no syncope. No ICD shocks.    Medications:  Current Outpatient Medications   Medication Instructions    aspirin 81 mg, Daily RT    atorvastatin  (LIPITOR) 10 mg, Daily RT    bisoprolol (ZEBETA) 2.5 mg, Daily RT    ciclopirox (Penlac) 8 % solution Daily    econazole nitrate 1 % cream 2 times daily    Farxiga  10 mg, oral, Daily    furosemide (LASIX) 20 mg, Daily RT    levothyroxine (SYNTHROID) 100 mcg, Daily RT    omeprazole (PRILOSEC) 10 mg, Daily RT    sacubitriL -valsartan  (Entresto ) 97-103 mg tablet 1 tablet, oral, 2 times daily    spironolactone  (ALDACTONE ) 25 mg, oral, Daily     Allergies: No Known Allergies    Past Medical History:   Diagnosis Date    Gastritis     Graves disease 2010    s/p radioactive ablation    Hyperlipidemia     Hypertension     LBBB (left bundle branch block) 06/2018    now s/p CRT-D    Mild mitral regurgitation     Nonischemic cardiomyopathy (Multi-HCC)     chronic HFrEf    Osteoarthritis      Past Surgical History:   Procedure Laterality Date    BREAST LUMPECTOMY       Social History     Tobacco Use    Smoking status: Never    Smokeless tobacco: Never   Substance Use Topics  Alcohol use: Never    Drug use: Never     Family History   Problem Relation Name Age of Onset    Coronary artery disease Mother      Heart disease Father      No Known Problems Daughter      No Known Problems Son      No Known Problems Son       Visit Vitals  BP 120/67 (BP Location: Left arm, Patient Position: Sitting, BP Cuff Size: Adult)   Pulse 68   Temp 36.6 C (97.9 F) (Oral)   Ht 1.549 m   Wt 64 kg   SpO2 98%   BMI 26.69 kg/m   BSA 1.66 m         Weight trend:  Wt Readings from Last 3 Encounters:   06/12/23 64 kg   05/13/23 68.5 kg   04/25/22 65.9 kg       Physical Exam:  appears well  JVP unelevated  hs S1+S2+, no murmurs   lungs clear  abdomen soft and non-tender  no LE edema, warm     Review of Systems:  A 12-point review of systems was completed and negative unless otherwise reported in the HPI.    Cardiac Testing:  ECG 08/10/20: A-sensed sinus rhythm, BiV paced    ECG 2019: Sinus rhythm, LBBB      TTE 05/2024: read pending, my review looks  stable    TTE 05/2023: LV normal size, LVEF 50-55%. RV normal size and function. Mild MR, mild TR. Mild AI. No significant change from prior.    Echo 04/25/2022: The left ventricle is normal in size. There is normal left ventricular wall thickness. Ejection Fraction = 50-55%. Left ventricular systolic function is normal. A full diastolic function study was completed with echocardiographic findings of Grade I diastolic dysfunction. No  regional wall motion abnormalities noted. The interventricular septum is asynchronous. The right ventricle is grossly normal size. There is a device lead in the right ventricle. The right ventricular systolic function is normal. The left atrium is moderately dilated. Right atrial size is normal. The inferior vena cava is normal size with > 50% respirophasic variation, consistent with normal right atrial pressure. There is no Color Doppler evidence for an atrial septal defect. There is mild mitral annular calcification. There is no mitral valve stenosis. There is mild mitral regurgitation. Tricuspid leaflets are thickened. There is no tricuspid stenosis. There is mild tricuspid regurgitation. Right ventricular systolic pressure is normal. The aortic valve is trileaflet. The aortic valve opens well. No hemodynamically significant valvular aortic stenosis. Mild aortic regurgitation. The pulmonic valve is not well visualized. There is no pulmonic valvular stenosis. Trace pulmonic valvular regurgitation. The aortic root is normal size. The ascending aorta is normal size. No Doppler or imaging evidence of an aortic coarctation. There is no pericardial effusion. Prominent epicardial fat pad.      Echo 08/10/2020:  The left ventricle is normal in size. There is normal left ventricular wall thickness. Left ventricular systolic function is mildly reduced. Ejection Fraction =  45-50%.  Global longitudinal strain is - 15%. The tissue Doppler diastolic parameters are indeterminate. There is mild  global hypokinesis of the left ventricle. The interventricular septum is asynchronous.  The right ventricle is normal in size and function  . There is a device lead in the right ventricle. TAPSE is 1.8cm. The left atrium is mildly dilated. Right atrial size is normal. The inferior vena cava is normal size with >  50% respirophasic variation, consistent with normal right atrial pressure. There is no Color Doppler evidence for an atrial septal defect. There is mild mitral annular calcification. The mitral valve leaflets appear mildly thickened,. There is no mitral valve  stenosis. There is mild mitral regurgitation. The tricuspid valve is normal in structure. There is mild tricuspid regurgitation. Right ventricular systolic pressure is normal. The aortic valve is mildly thickened. The aortic valve is trileaflet. The aortic valve opens well. No hemodynamically significant valvular aortic stenosis. Mild aortic regurgitation. The pulmonic valve is not well seen, but is grossly normal. Trace pulmonic valvular regurgitation. The aortic root is normal size. The ascending aorta is normal size. There is no pericardial effusion. Prominent anterior fat pad.       Echo 03/14/16: The left ventricle is mildly dilated. There is no thrombus. There is normal left ventricular wall thickness. Left ventricular systolic function is moderately reduced.Ejection Fraction = 35-40%. The interventricular septum is asynchronous. Basal-mid septal akinesis. Mild mitral and aortic valve regurgitation.    08/10/20 CRT-D interrogation: Normal DDD CRT-D function, no changes made to settings. Histogram shows 0.3% atrial pacing, 98.3% bi- ventricular pacing, good heart rate distribution, no new atrial or ventricular arrhythmias since last visit in October 2020. LV capture threshold 2.75V at 0.63ms. Home remote monitoring will continue. The patient will return to clinic in 1 year.        05/27/19 CRT-D interrogation: Normal dual CRT ICD function, no new  atrial arrhythmias, no new ventricular arrhythmia  detections . 27 % atrial pacing, 97 % biventricular pacing, good heart rate distribution. Battery longevity is 7 years, home monitoring will continue.      LHC 2008: without evidence of CAD per patient report      Blood Work:  No results found for: NA, K, CL, CO2, BUN, CREATININE, GLUCOSE, CALCIUM, MG, BNP, NT, HGBA1C, TSH, TROPONINI, TROPOT, FERRITIN, IRONSAT No results found for: BNP, NTPROBNP       Assessment/Plan:    In summary, Sameena Artus is a 74 y.o. year old with a long-standing history of chronic HFrEF, NICM, LBBB and mild MR who is now s/p CRT-D in 06/2018.     Nonischemic cardiomyopathy  HFimpEF  NYHA class: NYHA Class: II  ACC stage: C    Medications:  Evidence-based beta blocker: bisoprolol 2.5 daily  ACE/ARB/ARNI: entresot 97/103  MRA: spiro 25  SGLT2i: dapa 10  Diuretic: lasix 20 daily  Other: none    Device: CRT-D    Advanced therapies status: not indicated.    Euvolemic and well compensated today. TTE shows stable LVEF 50-55%. Already on max tolerated medications. Labs from [facility] 04/2024 reviewed. No changes today.    LBBB, now s/p CRT  Continues device interrogations with [place], no arrhythmias thus far. CRT pacing 98%.    Disposition/Follow up  Next clinic visit: 1 year     THAIS LOCKWOOD, MD 06/12/24, 1:23 PM    On the day of the encounter I spent 30 minutes on the total patient encounter, including time reviewing tests and/or medical records, taking a history, performing a medically appropriate examination and/or evaluation, counseling and educating the patient/family/caregiver, completing clinical documentation in the electronic or other health record, independently interpreting test results and communicating results to the patient/family/caregiver and care coordination including communication with referring provider(s).

## 2024-08-10 ENCOUNTER — Inpatient Hospital Stay: Admit: 2024-09-24 | Payer: MEDICARE

## 2024-08-10 DIAGNOSIS — I429 Cardiomyopathy, unspecified: Secondary | ICD-10-CM

## 2024-09-24 LAB — CARDIAC DEVICE CHECK CHECK - REMOTE
Atrial Tachy Statistic AT/AF Burden Percent: 0 %
Atrial Tachy Statistic Date Time End: 20251222033326
Atrial Tachy Statistic Date Time Start: 20250922164822
Battery Date Time of Measurements: 20251222033323
Battery RRT Trigger: 2.727
Battery Remaining Longevity: 16 mo
Battery Voltage: 2.9 V
Brady Setting AT Mode Switch Rate: 171 {beats}/min
Brady Setting Lower Rate Limit: 50 {beats}/min
Brady Setting Maximum Sensor Rate: 120 {beats}/min
Brady Setting Maximum Tracking Rate: 120 {beats}/min
Brady Setting PAV Delay Low: 140 ms
Brady Setting SAV Delay Low: 130 ms
Brady Statistic AP VP Percent: 0.35 %
Brady Statistic AP VS Percent: 0.01 %
Brady Statistic AS VP Percent: 98.12 %
Brady Statistic AS VS Percent: 1.52 %
Brady Statistic Date Time End: 20251222033326
Brady Statistic Date Time Start: 20250922164822
Brady Statistic RA Percent Paced: 0.36 %
Brady Statistic RV Percent Paced: 1.27 %
CRT Statistic CRT Percent Paced: 1.24 %
CRT Statistic Date Time End: 20251222033326
CRT Statistic Date Time Start: 20250922164822
CRT Statistic LV Percent Paced: 98.15 %
Capacitor Charge Energy: 18 J
Capacitor Charge Time: 4.464
Capacitor Last Charge Date Time: 20251017103847
Date Time Interrogation Session: 20251222033326
Episode Date Time: 20251216142219
Episode Date Time: 20251219102724
Episode Date Time: 20251219103149
Episode Date Time: 20251221174244
Episode Date Time: 20251221174352
Episode Date Time: 20251221174446
Episode Date Time: 20251221174537
Episode Duration: 10 s
Episode Duration: 13 s
Episode Duration: 16 s
Episode Duration: 20 s
Episode Duration: 6 s
Episode Duration: 8 s
Episode Duration: 9 s
Episode Identifier: 1745
Episode Identifier: 1746
Episode Identifier: 1747
Episode Identifier: 1748
Episode Identifier: 1749
Episode Identifier: 1750
Episode Identifier: 1751
Episode Statistic Recent Count: 0
Episode Statistic Recent Count: 0
Episode Statistic Recent Count: 0
Episode Statistic Recent Count: 0
Episode Statistic Recent Count: 0
Episode Statistic Recent Count: 0
Episode Statistic Recent Count: 0
Episode Statistic Recent Date Time End: 20251222033326
Episode Statistic Recent Date Time End: 20251222033326
Episode Statistic Recent Date Time End: 20251222033326
Episode Statistic Recent Date Time End: 20251222033326
Episode Statistic Recent Date Time End: 20251222033326
Episode Statistic Recent Date Time End: 20251222033326
Episode Statistic Recent Date Time End: 20251222033326
Episode Statistic Recent Date Time Start: 20250922164822
Episode Statistic Recent Date Time Start: 20250922164822
Episode Statistic Recent Date Time Start: 20250922164822
Episode Statistic Recent Date Time Start: 20250922164822
Episode Statistic Recent Date Time Start: 20250922164822
Episode Statistic Recent Date Time Start: 20250922164822
Episode Statistic Recent Date Time Start: 20250922164822
Episode Statistic Total Count: 0
Episode Statistic Total Count: 0
Episode Statistic Total Count: 0
Episode Statistic Total Count: 0
Episode Statistic Total Count: 0
Episode Statistic Total Count: 0
Episode Statistic Total Count: 1
Episode Statistic Total Date Time End: 20251222033326
Episode Statistic Total Date Time End: 20251222033326
Episode Statistic Total Date Time End: 20251222033326
Episode Statistic Total Date Time End: 20251222033326
Episode Statistic Total Date Time End: 20251222033326
Episode Statistic Total Date Time End: 20251222033326
Episode Statistic Total Date Time End: 20251222033326
Implantable Lead Implant Date: 20191119
Implantable Lead Implant Date: 20191119
Implantable Lead Implant Date: 20191119
Implantable Lead Model: 4298
Implantable Lead Model: 5076
Implantable Pulse Generator Implant Date: 20191119
Lead Channel Impedance Value: 1007 Ohm
Lead Channel Impedance Value: 1121 Ohm
Lead Channel Impedance Value: 1178 Ohm
Lead Channel Impedance Value: 1235 Ohm
Lead Channel Impedance Value: 1292 Ohm
Lead Channel Impedance Value: 1292 Ohm
Lead Channel Impedance Value: 301.328
Lead Channel Impedance Value: 319.42 Ohm
Lead Channel Impedance Value: 329.069
Lead Channel Impedance Value: 354.667
Lead Channel Impedance Value: 366.603
Lead Channel Impedance Value: 418 Ohm
Lead Channel Impedance Value: 513 Ohm
Lead Channel Impedance Value: 551 Ohm
Lead Channel Impedance Value: 589 Ohm
Lead Channel Impedance Value: 665 Ohm
Lead Channel Impedance Value: 760 Ohm
Lead Channel Impedance Value: 817 Ohm
Lead Channel Pacing Threshold Amplitude: 0.5 V
Lead Channel Pacing Threshold Amplitude: 0.625 V
Lead Channel Pacing Threshold Amplitude: 0.875 V
Lead Channel Pacing Threshold Pulse Width: 0.4 ms
Lead Channel Pacing Threshold Pulse Width: 0.4 ms
Lead Channel Pacing Threshold Pulse Width: 0.4 ms
Lead Channel Sensing Intrinsic Amplitude: 1.625 mV
Lead Channel Sensing Intrinsic Amplitude: 1.625 mV
Lead Channel Sensing Intrinsic Amplitude: 17 mV
Lead Channel Sensing Intrinsic Amplitude: 17 mV
Lead Channel Setting Pacing Amplitude: 1.5 V
Lead Channel Setting Pacing Amplitude: 1.5 V
Lead Channel Setting Pacing Amplitude: 2 V
Lead Channel Setting Pacing Pulse Width: 0.4 ms
Lead Channel Setting Pacing Pulse Width: 0.4 ms
Lead Channel Setting Pacing Pulse Width: 0.7 ms
Lead Channel Setting Sensing Sensitivity: 0.3 mV
Lead Channel Setting Sensing Sensitivity: 0.45 mV
Therapy Statistic Recent ATP Delivered: 0
Therapy Statistic Recent Date Time End: 20251222033326
Therapy Statistic Recent Date Time Start: 20250922164822
Therapy Statistic Recent Shocks Aborted: 0
Therapy Statistic Recent Shocks Delivered: 0
Therapy Statistic Total  Date Time End: 20251222033326
Therapy Statistic Total ATP Delivered: 0
Therapy Statistic Total Shocks Aborted: 0
Therapy Statistic Total Shocks Delivered: 0
Zone Setting Detection Beats Denominator: 32 {beats}
Zone Setting Detection Beats Numerator: 24 {beats}
Zone Setting Detection Interval: 300 ms
Zone Setting Detection Interval: 350 ms
Zone Setting Detection Interval: 350 ms
Zone Setting Detection Interval: 360 ms
